# Patient Record
Sex: Female | Born: 1970 | Race: White | Hispanic: No | Marital: Single | Smoking: Never smoker
Health system: Southern US, Community
[De-identification: ages and names within clinical notes are randomized; demographics above are authoritative.]

## PROBLEM LIST (undated history)

## (undated) ENCOUNTER — Emergency Department (HOSPITAL_COMMUNITY): Admission: EM | Payer: Medicaid Other | Source: Home / Self Care

## (undated) DIAGNOSIS — Z789 Other specified health status: Secondary | ICD-10-CM

## (undated) DIAGNOSIS — T7840XA Allergy, unspecified, initial encounter: Secondary | ICD-10-CM

## (undated) HISTORY — DX: Other specified health status: Z78.9

## (undated) HISTORY — DX: Allergy, unspecified, initial encounter: T78.40XA

## (undated) HISTORY — PX: KNEE SURGERY: SHX244

---

## 2014-11-28 ENCOUNTER — Emergency Department (HOSPITAL_COMMUNITY)
Admission: EM | Admit: 2014-11-28 | Discharge: 2014-11-28 | Disposition: A | Discharge status: Home / Self Care | Payer: 59 | Source: Home / Self Care | Attending: Emergency Medicine | Admitting: Emergency Medicine

## 2014-11-28 ENCOUNTER — Encounter (HOSPITAL_COMMUNITY): Payer: Self-pay

## 2014-11-28 DIAGNOSIS — Z23 Encounter for immunization: Secondary | ICD-10-CM | POA: Diagnosis not present

## 2014-11-28 DIAGNOSIS — S61411A Laceration without foreign body of right hand, initial encounter: Secondary | ICD-10-CM | POA: Diagnosis not present

## 2014-11-28 MED ORDER — PENTAFLUOROPROP-TETRAFLUOROETH EX AERO
INHALATION_SPRAY | CUTANEOUS | Status: AC
  Filled 2014-11-28: qty 103.5

## 2014-11-28 MED ORDER — TETANUS-DIPHTH-ACELL PERTUSSIS 5-2.5-18.5 LF-MCG/0.5 IM SUSP
INTRAMUSCULAR | Status: AC
  Filled 2014-11-28: qty 0.5

## 2014-11-28 MED ORDER — TETANUS-DIPHTH-ACELL PERTUSSIS 5-2.5-18.5 LF-MCG/0.5 IM SUSP
0.5000 mL | Freq: Once | INTRAMUSCULAR | Status: AC
  Administered 2014-11-28: 0.5 mL via INTRAMUSCULAR

## 2014-11-28 NOTE — Discharge Instructions (Signed)

## 2014-11-28 NOTE — ED Notes (Signed)
Reportedly sustained laceration to her right hand while polishing a wine glass just PTA. Bleeding controlled at present

## 2014-11-28 NOTE — ED Provider Notes (Addendum)
CSN: [id number]     Arrival date & time 11/28/14  1634 History   First MD Initiated Contact with Patient 11/28/14 1705     Chief Complaint  Patient presents with  . Extremity Laceration   (Consider location/radiation/quality/duration/timing/severity/associated sxs/prior Treatment) HPI Comments: 44 year old female accidentally received a laceration to the right hand MCP of the index finger. This approximately 2 and half centimeters in length. There is a small flap has a terminal end.   History reviewed. No pertinent past medical history. History reviewed. No pertinent past surgical history. History reviewed. No pertinent family history. History  Substance Use Topics  . Smoking status: Never Smoker   . Smokeless tobacco: Not on file  . Alcohol Use: No   OB History    No data available     Review of Systems  Constitutional: Negative.   Musculoskeletal: Negative for joint swelling.  Skin: Positive for wound.    Allergies  Review of patient's allergies indicates not on file.  Home Medications   Prior to Admission medications   Not on File   BP 128/87 mmHg  Pulse 93  Temp(Src) 98.4 F (36.9 C) (Oral)  Resp 16  SpO2 100%  LMP 11/21/2014 Physical Exam  Constitutional: She is oriented to person, place, and time. She appears well-developed and well-nourished. No distress.  Musculoskeletal: She exhibits no edema or tenderness.  Neurological: She is alert and oriented to person, place, and time.  Skin: Skin is warm.  Right index finger with active flexion and extension at all joints including the MCP. The wound is superficial and includes the dermis in a portion of the subcutaneous tissue. No tendons or other underlying structures are visible or palpable. Minor bleeding.  Nursing note and vitals reviewed.   ED Course  LACERATION REPAIR Date/Time: 11/28/2014 6:19 PM Performed by: Marcha Dutton, Josemiguel Gries Authorized by: Melony Overly Consent: Verbal consent obtained. Risks and  benefits: risks, benefits and alternatives were discussed Consent given by: patient Patient understanding: patient states understanding of the procedure being performed Patient identity confirmed: verbally with patient Body area: upper extremity Location details: right hand Laceration length: 2.7 cm Foreign bodies: no foreign bodies Tendon involvement: none Nerve involvement: none Vascular damage: no Anesthesia: local infiltration Local anesthetic: lidocaine 2% without epinephrine Anesthetic total: 3 ml Patient sedated: no Preparation: Patient was prepped and draped in the usual sterile fashion. Irrigation solution: saline Irrigation method: syringe Amount of cleaning: standard Debridement: none Degree of undermining: none Skin closure: 4-0 nylon Number of sutures: 5 Technique: simple Approximation: close Approximation difficulty: simple Dressing: 4x4 sterile gauze Patient tolerance: Patient tolerated the procedure well with no immediate complications   (including critical care time) Labs Review Labs Reviewed - No data to display  Imaging Review No results found.   MDM   1. Laceration of hand, right, initial encounter    Laceration repair with 5 sutures. Watch for infection Suture removal in 10 days. Tdap .5cc IM Janne Napoleon, NP 11/28/14 1824  Janne Napoleon, NP 11/30/14 [phone]

## 2017-11-04 ENCOUNTER — Ambulatory Visit: Payer: Self-pay | Admitting: Obstetrics and Gynecology

## 2017-11-06 ENCOUNTER — Ambulatory Visit: Payer: Self-pay | Admitting: Obstetrics and Gynecology

## 2017-11-18 ENCOUNTER — Ambulatory Visit (INDEPENDENT_AMBULATORY_CARE_PROVIDER_SITE_OTHER): Payer: Medicaid Other | Admitting: Nurse Practitioner

## 2017-11-18 ENCOUNTER — Other Ambulatory Visit (HOSPITAL_COMMUNITY)
Admission: RE | Admit: 2017-11-18 | Discharge: 2017-11-18 | Disposition: A | Discharge status: Home / Self Care | Payer: Medicaid Other | Source: Ambulatory Visit | Attending: Nurse Practitioner | Admitting: Nurse Practitioner

## 2017-11-18 ENCOUNTER — Encounter: Payer: Self-pay | Admitting: Nurse Practitioner

## 2017-11-18 VITALS — BP 118/77 | HR 78 | Ht 64.0 in | Wt 148.2 lb

## 2017-11-18 DIAGNOSIS — F32A Depression, unspecified: Secondary | ICD-10-CM

## 2017-11-18 DIAGNOSIS — Z309 Encounter for contraceptive management, unspecified: Secondary | ICD-10-CM

## 2017-11-18 DIAGNOSIS — Z01411 Encounter for gynecological examination (general) (routine) with abnormal findings: Secondary | ICD-10-CM | POA: Insufficient documentation

## 2017-11-18 DIAGNOSIS — F329 Major depressive disorder, single episode, unspecified: Secondary | ICD-10-CM

## 2017-11-18 DIAGNOSIS — N921 Excessive and frequent menstruation with irregular cycle: Secondary | ICD-10-CM | POA: Insufficient documentation

## 2017-11-18 MED ORDER — MEDROXYPROGESTERONE ACETATE 150 MG/ML IM SUSP: 150.0000 mg | INTRAMUSCULAR | 5 refills | Status: DC

## 2017-11-18 MED ORDER — FLUOXETINE HCL 20 MG PO CAPS: 20.0000 mg | ORAL_CAPSULE | Freq: Every day | ORAL | 3 refills | Status: DC

## 2017-11-18 MED ORDER — FLUOXETINE HCL 20 MG PO TABS: 20.0000 mg | ORAL_TABLET | Freq: Every day | ORAL | 3 refills | Status: DC

## 2017-11-18 NOTE — Patient Instructions (Addendum)
Living With Depression Everyone experiences occasional disappointment, sadness, and loss in their lives. When you are feeling down, blue, or sad for at least 2 weeks in a row, it may mean that you have depression. Depression can affect your thoughts and feelings, relationships, daily activities, and physical health. It is caused by changes in the way your brain functions. If you receive a diagnosis of depression, your health care provider will tell you which type of depression you have and what treatment options are available to you. If you are living with depression, there are ways to help you recover from it and also ways to prevent it from coming back. How to cope with lifestyle changes Coping with stress Stress is your body's reaction to life changes and events, both good and bad. Stressful situations may include:  Getting married.  The death of a spouse.  Losing a job.  Retiring.  Having a baby.  Stress can last just a few hours or it can be ongoing. Stress can play a major role in depression, so it is important to learn both how to cope with stress and how to think about it differently. Talk with your health care provider or a counselor if you would like to learn more about stress reduction. He or she may suggest some stress reduction techniques, such as:  Music therapy. This can include creating music or listening to music. Choose music that you enjoy and that inspires you.  Mindfulness-based meditation. This kind of meditation can be done while sitting or walking. It involves being aware of your normal breaths, rather than trying to control your breathing.  Centering prayer. This is a kind of meditation that involves focusing on a spiritual word or phrase. Choose a word, phrase, or sacred image that is meaningful to you and that brings you peace.  Deep breathing. To do this, expand your stomach and inhale slowly through your nose. Hold your breath for 3-5 seconds, then exhale  slowly, allowing your stomach muscles to relax.  Muscle relaxation. This involves intentionally tensing muscles then relaxing them.  Choose a stress reduction technique that fits your lifestyle and personality. Stress reduction techniques take time and practice to develop. Set aside 5-15 minutes a day to do them. Therapists can offer training in these techniques. The training may be covered by some insurance plans. Other things you can do to manage stress include:  Keeping a stress diary. This can help you learn what triggers your stress and ways to control your response.  Understanding what your limits are and saying no to requests or events that lead to a schedule that is too full.  Thinking about how you respond to certain situations. You may not be able to control everything, but you can control how you react.  Adding humor to your life by watching funny films or TV shows.  Making time for activities that help you relax and not feeling guilty about spending your time this way.  Medicines Your health care provider may suggest certain medicines if he or she feels that they will help improve your condition. Avoid using alcohol and other substances that may prevent your medicines from working properly (may interact). It is also important to:  Talk with your pharmacist or health care provider about all the medicines that you take, their possible side effects, and what medicines are safe to take together.  Make it your goal to take part in all treatment decisions (shared decision-making). This includes giving input on the side   effects of medicines. It is best if shared decision-making with your health care provider is part of your total treatment plan.  If your health care provider prescribes a medicine, you may not notice the full benefits of it for 4-8 weeks. Most people who are treated for depression need to be on medicine for at least 6-12 months after they feel better. If you are taking  medicines as part of your treatment, do not stop taking medicines without first talking to your health care provider. You may need to have the medicine slowly decreased (tapered) over time to decrease the risk of harmful side effects. Relationships Your health care provider may suggest family therapy along with individual therapy and drug therapy. While there may not be family problems that are causing you to feel depressed, it is still important to make sure your family learns as much as they can about your mental health. Having your family's support can help make your treatment successful. How to recognize changes in your condition Everyone has a different response to treatment for depression. Recovery from major depression happens when you have not had signs of major depression for two months. This may mean that you will start to:  Have more interest in doing activities.  Feel less hopeless than you did 2 months ago.  Have more energy.  Overeat less often, or have better or improving appetite.  Have better concentration.  Your health care provider will work with you to decide the next steps in your recovery. It is also important to recognize when your condition is getting worse. Watch for these signs:  Having fatigue or low energy.  Eating too much or too little.  Sleeping too much or too little.  Feeling restless, agitated, or hopeless.  Having trouble concentrating or making decisions.  Having unexplained physical complaints.  Feeling irritable, angry, or aggressive.  Get help as soon as you or your family members notice these symptoms coming back. How to get support and help from others How to talk with friends and family members about your condition Talking to friends and family members about your condition can provide you with one way to get support and guidance. Reach out to trusted friends or family members, explain your symptoms to them, and let them know that you are  working with a health care provider to treat your depression. Financial resources Not all insurance plans cover mental health care, so it is important to check with your insurance carrier. If paying for co-pays or counseling services is a problem, search for a local or county mental health care center. They may be able to offer public mental health care services at low or no cost when you are not able to see a private health care provider. If you are taking medicine for depression, you may be able to get the generic form, which may be less expensive. Some makers of prescription medicines also offer help to patients who cannot afford the medicines they need. Follow these instructions at home:  Get the right amount and quality of sleep.  Cut down on using caffeine, tobacco, alcohol, and other potentially harmful substances.  Try to exercise, such as walking or lifting small weights.  Take over-the-counter and prescription medicines only as told by your health care provider.  Eat a healthy diet that includes plenty of vegetables, fruits, whole grains, low-fat dairy products, and lean protein. Do not eat a lot of foods that are high in solid fats, added sugars, or salt.    Keep all follow-up visits as told by your health care provider. This is important. Contact a health care provider if:  You stop taking your antidepressant medicines, and you have any of these symptoms: ? Nausea. ? Headache. ? Feeling lightheaded. ? Chills and body aches. ? Not being able to sleep (insomnia).  You or your friends and family think your depression is getting worse. Get help right away if:  You have thoughts of hurting yourself or others. If you ever feel like you may hurt yourself or others, or have thoughts about taking your own life, get help right away. You can go to your nearest emergency department or call:  Your local emergency services (911 in the [place]  A suicide crisis helpline, such as the  National Suicide Prevention Lifeline at 1-800-273-8255. This is open 24-hours a day.  Summary  If you are living with depression, there are ways to help you recover from it and also ways to prevent it from coming back.  Work with your health care team to create a management plan that includes counseling, stress management techniques, and healthy lifestyle habits. This information is not intended to replace advice given to you by your health care provider. Make sure you discuss any questions you have with your health care provider. Document Released: 09/03/2016 Document Revised: 09/03/2016 Document Reviewed: 09/03/2016 Elsevier Interactive Patient Education  2018 Elsevier Inc.  

## 2017-11-18 NOTE — Addendum Note (Signed)
Addended by: Merryl Hacker on: 11/18/2017 01:41 PM   Modules accepted: Orders

## 2017-11-18 NOTE — Progress Notes (Signed)
GYNECOLOGY ANNUAL PREVENTATIVE CARE ENCOUNTER NOTE  Subjective:   Sheryl Bradley is a 47 y.o. G80P2002 female here for a routine annual gynecologic exam.  Current complaints: depression due to mother's death in 06/16/2023 and she left her current partner after learning he was bisexual and was having problems with drug addiction.   She currently cares for her 2 children and works.  Denies abnormal vaginal bleeding, discharge, pelvic pain, or problems with intercourse.  Does report needing STD evaluation, needing Prozac for her current depression and crying that she has periodically almost daily, needs hemorrhoids checked, and had a bump she wants checked on right labia majora.  Having very heavy menses and now irregular menses.  Thinking this is early menopause.    Gynecologic History Patient's last menstrual period was 11/14/2017 (exact date). Contraception: abstinence Last Pap: was 2 years ago.  Reports results were normal.  Previously had a "scraping" of her cervix due to an abnormal pap - says yes it was a leep.  Last mammogram: was a coupla of years ago.  Results were: normal  Obstetric History OB History  Gravida Para Term Preterm AB Living  2 2 2     2   SAB TAB Ectopic Multiple Live Births          2    # Outcome Date GA Lbr Len/2nd Weight Sex Delivery Anes PTL Lv  2 Term 2013        LIV  1 Term 2003        LIV      Past Medical History:  Diagnosis Date  . Known health problems: none     Past Surgical History:  Procedure Laterality Date  . KNEE SURGERY Left     Current Outpatient Medications on File Prior to Visit  Medication Sig Dispense Refill  . Biotin 1000 MCG tablet Take 1,000 mcg by mouth 3 (three) times daily.    . diphenhydrAMINE (BENADRYL) 25 mg capsule Take 25 mg by mouth at bedtime.    . ferrous sulfate 325 (65 FE) MG EC tablet Take 325 mg by mouth daily.     No current facility-administered medications on file prior to visit.     No Known  Allergies  Social History   Socioeconomic History  . Marital status: Married    Spouse name: Not on file  . Number of children: Not on file  . Years of education: Not on file  . Highest education level: Not on file  Social Needs  . Financial resource strain: Not on file  . Food insecurity - worry: Not on file  . Food insecurity - inability: Not on file  . Transportation needs - medical: Not on file  . Transportation needs - non-medical: Not on file  Occupational History  . Not on file  Tobacco Use  . Smoking status: Never Smoker  . Smokeless tobacco: Never Used  Substance and Sexual Activity  . Alcohol use: No  . Drug use: Yes    Types: Marijuana    Comment: occasionally  . Sexual activity: Not Currently    Birth control/protection: None  Other Topics Concern  . Not on file  Social History Narrative  . Not on file    Family History  Problem Relation Age of Onset  . Hypertension Paternal Grandfather   . Hypertension Paternal Grandmother   . Hypertension Maternal Grandmother   . Hypertension Maternal Grandfather   . Hypertension Father   . Heart attack Mother   .  Hypertension Mother   . Stroke Mother   . Alcohol abuse Brother     The following portions of the patient's history were reviewed and updated as appropriate: allergies, current medications, past family history, past medical history, past social history, past surgical history and problem list.  Review of Systems A comprehensive review of systems was negative except for: occasional chest pain on the left side since her mother died of a heart attack in 07/05/2017.  No dizziness, no fainting, no palpitations, or episodes of sweating. Marland Kitchen  Has no edema in her legs, does have hemorrhoids but no constipation Has heavy menses with cramping that starts a week before the bleeding begins - she does not take medication for cramping. Has a bump in the genital area that she has felt but it is not painful. Has tearful  times since the loss of her mother and the father of her children is not in the picture.   Objective:  BP 118/77   Pulse 78   Ht 5\' 4"  (1.626 m)   Wt 148 lb 3.2 oz (67.2 kg)   LMP 11/14/2017 (Exact Date)   BMI 25.44 kg/m  CONSTITUTIONAL: Well-developed, well-nourished female who does become tearful when talking about the recent losses in her life.  HENT:  Normocephalic, atraumatic, External right and left ear normal.  EYES: Conjunctivae and EOM are normal. Pupils are equal, round,  NECK: Normal range of motion, supple, no masses.  Normal thyroid.  SKIN: Skin is warm and dry. No rash noted. Not diaphoretic. No erythema. No pallor. NEUROLOGIC: Alert and oriented to person, place, and time. Normal muscle tone and coordination. No cranial nerve deficit noted. PSYCHIATRIC: Normal mood and affect. Normal behavior. Normal judgment and thought content. CARDIOVASCULAR: Normal heart rate noted, regular rhythm RESPIRATORY: Clear to auscultation bilaterally. Effort and breath sounds normal, no problems with respiration noted. BREASTS: Symmetric in size. No masses, skin changes, nipple drainage, or lymphadenopathy. ABDOMEN: Soft, no distention noted.  No tenderness, rebound or guarding.  PELVIC: Normal appearing external genitalia; normal appearing vaginal mucosa and cervix.  No abnormal discharge noted.  Pap smear obtained.  Normal uterine size, no other palpable masses, no uterine or adnexal tenderness.  No bump found on labia and client reports it is not there at present.  Pointed out the spot where it had been.  No skin lesions seen or masses palpated. MUSCULOSKELETAL: Normal range of motion. No tenderness.  No cyanosis, clubbing, or edema.    Assessment and Plan:  1. Encounter for well woman exam with abnormal findings Recommend that she get the mammography scholarship information as [facility] will likely not cover the mammogram. Discussed at length the options for controlling the heavy vaginal  bleeding and severe cramping with her menses.  She declines any "foreign object" in her body.  Discussed progestin only pills or Depo injections and she is much more interested in the Depo shot.  She has a twin sister that uses Depo and likes it.  Will order and she will return for the injection.  - Cytology - PAP - Cervicovaginal ancillary only - Hepatitis B surface antigen - HIV antibody - RPR - Ambulatory referral to [facility] - CBC - Hepatitis C antibody  2. Depression, unspecified depression type PHQ9 done at the end of the visit with a score of 25.  Client reported in her visit that she has lots of friends who she talks with regularly.  Reports eating 2 meals a day and sleeping from 5-8 hours  a night.  Reports she did have suicidal ideations after her mother died but is not having them now and is not having suicidal ideations today.  (PQH9 results reviewed and her score is much higher than I expected it would be based on our discussion at the time of her exam.)  Is requesting Prozac as she has taken it before and does think it will be helpful.  Has tried 2 other medications for depression in the past and they were not helpful.  Thinks that Prozac will be a good choice for her.  Reviewed the self care activities that she is doing - not smoking, not drinking, does eat daily and BMI is currently 25.  Discussed losing 4 pounds so she will have a BMI that is under 25.  She reports she is not exercising and thinks that is something that she can do.  Advised making a list of activities that will be care for her such as having a cup of tea in a relaxing atmosphere.  Also advised being intentional about noticing when she has good days and then knowing that every day will not be a good day and using self care activities especially on those days.   Will follow up results of pap smear and manage accordingly. Mammogram scheduled Behavioral health referral made.  Will need depression followed up  there. Routine preventative health maintenance measures emphasized. Please refer to After Visit Summary for other counseling recommendations.  Advised that she consider establishing with a PCP as that would be the doctor to see if she continues to have concerns about her heart - at this time, it seems to be an over identification with her mother's heart attack at age 32.  Reviewed how for her to be healthy - see note above about points that were discussed.  Earlie Server, RN, MSN, NP-BC Nurse Practitioner, [facility] for [facility]

## 2017-11-19 ENCOUNTER — Ambulatory Visit (INDEPENDENT_AMBULATORY_CARE_PROVIDER_SITE_OTHER): Payer: Medicaid Other

## 2017-11-19 DIAGNOSIS — Z309 Encounter for contraceptive management, unspecified: Secondary | ICD-10-CM | POA: Diagnosis not present

## 2017-11-19 DIAGNOSIS — Z3042 Encounter for surveillance of injectable contraceptive: Secondary | ICD-10-CM

## 2017-11-19 LAB — HEPATITIS B SURFACE ANTIGEN: Hepatitis B Surface Ag: NEGATIVE

## 2017-11-19 LAB — CERVICOVAGINAL ANCILLARY ONLY
Bacterial vaginitis: NEGATIVE
CHLAMYDIA, DNA PROBE: NEGATIVE
Candida vaginitis: POSITIVE — AB
Neisseria Gonorrhea: NEGATIVE
Trichomonas: NEGATIVE

## 2017-11-19 LAB — CBC
Hematocrit: 39.4 % (ref 34.0–46.6)
Hemoglobin: 12.9 g/dL (ref 11.1–15.9)
MCH: 28.2 pg (ref 26.6–33.0)
MCHC: 32.7 g/dL (ref 31.5–35.7)
MCV: 86 fL (ref 79–97)
PLATELETS: 313 10*3/uL (ref 150–379)
RBC: 4.57 x10E6/uL (ref 3.77–5.28)
RDW: 16.3 % — ABNORMAL HIGH (ref 12.3–15.4)
WBC: 6.1 10*3/uL (ref 3.4–10.8)

## 2017-11-19 LAB — HEPATITIS C ANTIBODY: Hep C Virus Ab: <0.1 s/co ratio (ref 0.0–0.9)

## 2017-11-19 LAB — RPR: RPR Ser Ql: NONREACTIVE

## 2017-11-19 LAB — HIV ANTIBODY (ROUTINE TESTING W REFLEX): HIV SCREEN 4TH GENERATION: NONREACTIVE

## 2017-11-19 MED ORDER — MEDROXYPROGESTERONE ACETATE 150 MG/ML IM SUSP
150.0000 mg | Freq: Once | INTRAMUSCULAR | Status: AC
  Administered 2017-11-19: 150 mg via INTRAMUSCULAR

## 2017-11-19 NOTE — Progress Notes (Signed)
Pt here for depo shot. Pt was seen in the office yesterday. Inj given in left upper outer quad. Pt tolerated well. Next depo due between 4/23-5/8.

## 2017-11-20 ENCOUNTER — Other Ambulatory Visit: Payer: Self-pay | Admitting: Nurse Practitioner

## 2017-11-20 ENCOUNTER — Encounter: Payer: Self-pay | Admitting: Nurse Practitioner

## 2017-11-20 ENCOUNTER — Telehealth: Payer: Self-pay

## 2017-11-20 MED ORDER — FLUCONAZOLE 150 MG PO TABS: 150.0000 mg | ORAL_TABLET | Freq: Every day | ORAL | 0 refills | Status: DC

## 2017-11-20 NOTE — Progress Notes (Signed)
Lab results reviewed and yeast was seen.  Sent prescription for Diflucan to client's pharmacy and sent a MyChart message to her.  Earlie Server, RN, MSN, NP-BC Nurse Practitioner, [facility] for [employer], [facility] 11/20/2017 11:05 AM

## 2017-11-20 NOTE — Telephone Encounter (Signed)
TC to pt regarding message about results.  Pt made aware in detail about +yeast result  And comfort measures to help in preventing yeast such as including probiotics in diet and avoiding too much sugar  Pt voiced understanding.

## 2017-11-21 ENCOUNTER — Encounter: Payer: Self-pay | Admitting: Nurse Practitioner

## 2017-11-21 LAB — CYTOLOGY - PAP
Diagnosis: NEGATIVE
HPV: NOT DETECTED

## 2017-11-25 ENCOUNTER — Encounter: Payer: Self-pay | Admitting: Nurse Practitioner

## 2017-11-27 ENCOUNTER — Telehealth: Payer: Self-pay

## 2017-11-27 NOTE — Telephone Encounter (Signed)
Returned call, no answer, left vm 

## 2017-11-29 ENCOUNTER — Other Ambulatory Visit: Payer: Self-pay | Admitting: Nurse Practitioner

## 2017-11-29 DIAGNOSIS — Z1231 Encounter for screening mammogram for malignant neoplasm of breast: Secondary | ICD-10-CM

## 2017-12-04 ENCOUNTER — Encounter: Payer: Self-pay | Admitting: Nurse Practitioner

## 2018-01-02 ENCOUNTER — Encounter: Payer: Self-pay | Admitting: Nurse Practitioner

## 2018-01-08 ENCOUNTER — Telehealth: Payer: Self-pay

## 2018-01-08 NOTE — Telephone Encounter (Signed)
Called pt in response to mychart message, no answer, left vm to call.

## 2018-02-13 ENCOUNTER — Ambulatory Visit: Payer: Medicaid Other

## 2018-02-14 ENCOUNTER — Ambulatory Visit: Payer: Medicaid Other

## 2018-02-17 ENCOUNTER — Ambulatory Visit (INDEPENDENT_AMBULATORY_CARE_PROVIDER_SITE_OTHER): Payer: Medicaid Other | Admitting: *Deleted

## 2018-02-17 ENCOUNTER — Other Ambulatory Visit: Payer: Self-pay | Admitting: Nurse Practitioner

## 2018-02-17 VITALS — BP 123/68 | HR 78 | Wt 142.0 lb

## 2018-02-17 DIAGNOSIS — Z3042 Encounter for surveillance of injectable contraceptive: Secondary | ICD-10-CM

## 2018-02-17 MED ORDER — MEDROXYPROGESTERONE ACETATE 150 MG/ML IM SUSP
150.0000 mg | Freq: Once | INTRAMUSCULAR | Status: AC
  Administered 2018-02-17: 150 mg via INTRAMUSCULAR

## 2018-02-17 MED ORDER — FLUOXETINE HCL 20 MG PO CAPS: 20.0000 mg | ORAL_CAPSULE | Freq: Every day | ORAL | 0 refills | Status: DC

## 2018-02-17 NOTE — Progress Notes (Signed)
Liisa [facility] 47 y.o. Client requests refill for Prozac.  Has no insurance and has not been seen by a therapist as requested when given the Prozac prescription initially.  Will refill for 30 days only and client must find a PCP or other provider to continue to get her Prozac.    Earlie Server, RN, MSN, NP-BC Nurse Practitioner, [facility] for [employer], [facility] 02/17/2018 3:50 PM

## 2018-02-17 NOTE — Progress Notes (Addendum)
Pt is in office for depo injection.  Pt is on time for injection. Pt supplied depo for this injection. Pt tolerated injection well. Pt advised to RTO July 22-Aug 5 for next depo.  BP 123/68   Pulse 78   Wt 142 lb (64.4 kg)   BMI 24.37 kg/m    Administrations This Visit    medroxyPROGESTERone (DEPO-PROVERA) injection 150 mg    Admin Date 02/17/2018 Action Given Dose 150 mg Route Intramuscular Administered By Valene Bors, CMA

## 2018-02-18 NOTE — Progress Notes (Signed)
I have reviewed the chart and agree with nursing staff's documentation of this patient's encounter.  Morene Crocker, CNM 02/18/2018 9:36 AM

## 2018-02-19 ENCOUNTER — Telehealth: Payer: Self-pay | Admitting: *Deleted

## 2018-02-19 NOTE — Telephone Encounter (Signed)
LM on VM making pt aware of Rx refill and need to establish care elsewhere in order to continue on medication. Advised to contact office if any other questions.

## 2018-02-19 NOTE — Telephone Encounter (Signed)
-----   Message from Virginia Rochester, NP sent at 02/17/2018  3:54 PM EDT ----- Regarding: RE: Prozac Rx Have refilled for 30 days with no refills and she must establish with a provider to continue to get this medication.  I cannot see her for a medication visit.  Thanks, Terri  ----- Message ----- From: Valene Bors, CMA Sent: 02/17/2018   2:34 PM To: Virginia Rochester, NP Subject: Prozac Rx                                      Karna Christmas,  You saw this pt in our office in Feb and gave her Rx for Prozac with 3 refills.  She was seen today for her depo injection and is in need of refills on Prozac.  I made her aware that you referred her for counseling in order to maintain Rx.  Pt has not been able to be seen at this time as she has no insurance. Pt plans to try to get in with [facility] since her son is seen there.  Please advise if you can refill Rx and/or if pt should have follow up appt here for med manage.  thanks

## 2018-03-09 ENCOUNTER — Encounter: Payer: Self-pay | Admitting: Nurse Practitioner

## 2018-05-02 ENCOUNTER — Encounter: Payer: Self-pay | Admitting: Nurse Practitioner

## 2018-05-08 ENCOUNTER — Ambulatory Visit: Payer: Medicaid Other

## 2018-05-13 ENCOUNTER — Ambulatory Visit: Payer: Medicaid Other

## 2018-05-16 ENCOUNTER — Ambulatory Visit: Payer: Medicaid Other

## 2018-05-19 ENCOUNTER — Ambulatory Visit (INDEPENDENT_AMBULATORY_CARE_PROVIDER_SITE_OTHER): Payer: Medicaid Other | Admitting: *Deleted

## 2018-05-19 ENCOUNTER — Ambulatory Visit: Payer: Medicaid Other

## 2018-05-19 VITALS — Wt 151.0 lb

## 2018-05-19 DIAGNOSIS — Z3042 Encounter for surveillance of injectable contraceptive: Secondary | ICD-10-CM | POA: Diagnosis not present

## 2018-05-19 MED ORDER — MEDROXYPROGESTERONE ACETATE 150 MG/ML IM SUSP
150.0000 mg | Freq: Once | INTRAMUSCULAR | Status: AC
  Administered 2018-05-19: 150 mg via INTRAMUSCULAR

## 2018-05-19 NOTE — Progress Notes (Signed)
Pt is in office for depo injection.  Pt supplied depo for today's visit.  Pt is on time for injection.  Pt has no other concerns today.  Pt advised to RTO 08/10/18 for next depo.  Administrations This Visit    medroxyPROGESTERone (DEPO-PROVERA) injection 150 mg    Admin Date 05/19/2018 Action Given Dose 150 mg Route Intramuscular Administered By Valene Bors, CMA

## 2018-05-19 NOTE — Progress Notes (Signed)
I have reviewed the chart and agree with nursing staff's documentation of this patient's encounter.  Mora Bellman, MD 05/19/2018 4:40 PM

## 2018-05-21 ENCOUNTER — Ambulatory Visit: Payer: Medicaid Other

## 2018-08-04 ENCOUNTER — Ambulatory Visit (INDEPENDENT_AMBULATORY_CARE_PROVIDER_SITE_OTHER): Payer: Medicaid Other | Admitting: *Deleted

## 2018-08-04 ENCOUNTER — Ambulatory Visit: Payer: Medicaid Other

## 2018-08-04 VITALS — BP 129/92 | HR 118 | Wt 147.0 lb

## 2018-08-04 DIAGNOSIS — Z3042 Encounter for surveillance of injectable contraceptive: Secondary | ICD-10-CM | POA: Diagnosis not present

## 2018-08-04 MED ORDER — MEDROXYPROGESTERONE ACETATE 150 MG/ML IM SUSP
150.0000 mg | Freq: Once | INTRAMUSCULAR | Status: AC
  Administered 2018-08-04: 150 mg via INTRAMUSCULAR

## 2018-08-04 NOTE — Progress Notes (Signed)
Pt is in office today for Depo injection. Pt is on time for injection.  Pt states she is currently bleeding heavily. Pt states that she has been passing large clots as well.  Injection given, pt tolerated well.  Pt advised to monitor bleeding- in worsens or no change- call office for appt or discuss alternatives. Pt states understanding.  BP (!) 129/92   Pulse (!) 118   Wt 147 lb (66.7 kg)   BMI 25.23 kg/m   Administrations This Visit    medroxyPROGESTERone (DEPO-PROVERA) injection 150 mg    Admin Date 08/04/2018 Action Given Dose 150 mg Route Intramuscular Administered By Valene Bors, CMA

## 2018-08-05 NOTE — Progress Notes (Signed)
I have reviewed the chart and agree with nursing staff's documentation of this patient's encounter.  Mora Bellman, MD 08/05/2018 3:48 PM

## 2018-08-11 ENCOUNTER — Ambulatory Visit: Payer: Medicaid Other

## 2018-10-20 ENCOUNTER — Ambulatory Visit: Payer: Medicaid Other

## 2018-10-22 ENCOUNTER — Ambulatory Visit: Payer: Medicaid Other

## 2018-10-22 NOTE — Progress Notes (Deleted)
disregard

## 2018-10-27 ENCOUNTER — Ambulatory Visit (INDEPENDENT_AMBULATORY_CARE_PROVIDER_SITE_OTHER): Payer: Medicaid Other

## 2018-10-27 VITALS — BP 105/79 | HR 85 | Wt 148.6 lb

## 2018-10-27 DIAGNOSIS — Z3042 Encounter for surveillance of injectable contraceptive: Secondary | ICD-10-CM

## 2018-10-27 MED ORDER — MEDROXYPROGESTERONE ACETATE 150 MG/ML IM SUSP
150.0000 mg | Freq: Once | INTRAMUSCULAR | Status: AC
  Administered 2018-10-27: 150 mg via INTRAMUSCULAR

## 2018-10-27 NOTE — Progress Notes (Signed)
Presents for DEPO, given in [place], tolerated well.  Next DEPO 03/31-04/14/2020  Administrations This Visit    medroxyPROGESTERone (DEPO-PROVERA) injection 150 mg    Admin Date 10/27/2018 Action Given Dose 150 mg Route Intramuscular Administered By Tamela Oddi, RMA

## 2019-01-14 ENCOUNTER — Ambulatory Visit: Payer: Medicaid Other | Admitting: Obstetrics and Gynecology

## 2019-01-14 ENCOUNTER — Ambulatory Visit: Payer: Medicaid Other

## 2019-01-15 ENCOUNTER — Telehealth: Payer: Self-pay

## 2019-01-15 ENCOUNTER — Other Ambulatory Visit: Payer: Self-pay | Admitting: Nurse Practitioner

## 2019-01-15 MED ORDER — MEGESTROL ACETATE 40 MG PO TABS: ORAL_TABLET | ORAL | 1 refills | Status: DC

## 2019-01-15 NOTE — Telephone Encounter (Signed)
The patient called and reports she has been bleeding since her last depo shot a couple months ago. She wants to stop the depo and would like to know if there is anything we can give her to stop the bleeding currently. She has an appt for a televisit on 4/7 where she wants to talk about switching her contraception from depo to something else.

## 2019-01-15 NOTE — Progress Notes (Signed)
Client called.  Having extended vaginal bleeding after being on Depo for one year.  Will prescribe Megace to help the bleeding stop and client has a visit scheduled to discuss other contraceptive options.  Earlie Server, RN, MSN, NP-BC Nurse Practitioner, [facility] for [employer], [place] 01/15/2019 4:34 PM

## 2019-01-20 ENCOUNTER — Ambulatory Visit (INDEPENDENT_AMBULATORY_CARE_PROVIDER_SITE_OTHER): Payer: Medicaid Other | Admitting: Student

## 2019-01-20 ENCOUNTER — Other Ambulatory Visit: Payer: Self-pay

## 2019-01-20 DIAGNOSIS — N939 Abnormal uterine and vaginal bleeding, unspecified: Secondary | ICD-10-CM

## 2019-01-20 NOTE — Progress Notes (Signed)
   TELEHEALTH VIRTUAL GYNECOLOGY VISIT ENCOUNTER NOTE  I connected with Sheryl Bradley on 01/20/19 at  3:35 PM EDT by telephone at home and verified that I am speaking with the correct person using two identifiers.   I discussed the limitations, risks, security and privacy concerns of performing an evaluation and management service by telephone and the availability of in person appointments. I also discussed with the patient that there may be a patient responsible charge related to this service. The patient expressed understanding and agreed to proceed.   History:  Sheryl Bradley is a 48 y.o. G58P2002 female being evaluated today for abnormal uterine bleeding. She denies any abnormal vaginal discharge,pelvic pain or other concerns.  She last had a pap in 2019; she is due for an annual exam. Patient says that she has bleeding for months on Depo. She had called the clinic and on 4-2 and was concerned about bleeding; was given RX for Megace and 1 refill. She says that her bleeding both on and off Depo was the same: heavy with clots. She is feeling better on Megace; now only using a menstrual pad.      Past Medical History:  Diagnosis Date  . Known health problems: none    Past Surgical History:  Procedure Laterality Date  . KNEE SURGERY Left    The following portions of the patient's history were reviewed and updated as appropriate: allergies, current medications, past family history, past medical history, past social history, past surgical history and problem list.   Health Maintenance:  Normal pap and negative HRHPV on 11-2017.    Review of Systems:  Pertinent items noted in HPI and remainder of comprehensive ROS otherwise negative.  Physical Exam:   General:  Alert, oriented and cooperative.   Mental Status: Normal mood and affect perceived. Normal judgment and thought content.  Physical exam deferred due to nature of the encounter  Labs and Imaging No results found for this or any  previous visit (from the past 336 hour(s)). No results found.    Assessment and Plan:     1. Abnormal uterine bleeding -Patient has responded well to Megace, will continue to take daily. She has a refill.  -Recommend IUD; she will come in at the end of the month for IUD and annual; while Covid-19 distancing is recommended, patient reports a significant amount of bleeding and warrants an in-person visit.  -discuss order for Mammogram at next visit.        I discussed the assessment and treatment plan with the patient. The patient was provided an opportunity to ask questions and all were answered. The patient agreed with the plan and demonstrated an understanding of the instructions.   The patient was advised to call back or seek an in-person evaluation/go to the ED if the symptoms worsen or if the condition fails to improve as anticipated.  I provided 15 minutes of non-face-to-face time during this encounter.   Starr Lake, [place] for [employer], [place]

## 2019-02-23 ENCOUNTER — Ambulatory Visit (INDEPENDENT_AMBULATORY_CARE_PROVIDER_SITE_OTHER): Payer: Medicaid Other | Admitting: Advanced Practice Midwife

## 2019-02-23 ENCOUNTER — Other Ambulatory Visit (HOSPITAL_COMMUNITY)
Admission: RE | Admit: 2019-02-23 | Discharge: 2019-02-23 | Disposition: A | Discharge status: Home / Self Care | Payer: Medicaid Other | Source: Ambulatory Visit | Attending: Advanced Practice Midwife | Admitting: Advanced Practice Midwife

## 2019-02-23 ENCOUNTER — Other Ambulatory Visit: Payer: Self-pay

## 2019-02-23 ENCOUNTER — Encounter: Payer: Self-pay | Admitting: Advanced Practice Midwife

## 2019-02-23 VITALS — BP 138/82 | HR 80 | Wt 151.2 lb

## 2019-02-23 DIAGNOSIS — Z0000 Encounter for general adult medical examination without abnormal findings: Secondary | ICD-10-CM

## 2019-02-23 DIAGNOSIS — Z01419 Encounter for gynecological examination (general) (routine) without abnormal findings: Secondary | ICD-10-CM | POA: Insufficient documentation

## 2019-02-23 DIAGNOSIS — Z30430 Encounter for insertion of intrauterine contraceptive device: Secondary | ICD-10-CM

## 2019-02-23 DIAGNOSIS — K644 Residual hemorrhoidal skin tags: Secondary | ICD-10-CM

## 2019-02-23 DIAGNOSIS — Z3202 Encounter for pregnancy test, result negative: Secondary | ICD-10-CM | POA: Diagnosis not present

## 2019-02-23 DIAGNOSIS — N939 Abnormal uterine and vaginal bleeding, unspecified: Secondary | ICD-10-CM

## 2019-02-23 LAB — POCT URINE PREGNANCY: Preg Test, Ur: NEGATIVE

## 2019-02-23 MED ORDER — LEVONORGESTREL 19.5 MCG/DAY IU IUD
INTRAUTERINE_SYSTEM | Freq: Once | INTRAUTERINE | Status: AC
  Administered 2019-02-23: 16:00:00 via INTRAUTERINE

## 2019-02-23 MED ORDER — MEGESTROL ACETATE 40 MG PO TABS: 40.0000 mg | ORAL_TABLET | Freq: Two times a day (BID) | ORAL | 1 refills | Status: DC

## 2019-02-23 NOTE — Progress Notes (Signed)
Pt presents for IUD insertion for abnormal UT bleeding.

## 2019-02-23 NOTE — Patient Instructions (Signed)

## 2019-02-23 NOTE — Progress Notes (Signed)
refeSubjective:     Sheryl Bradley is a 48 y.o. female here for a routine exam.  Current complaints: wants to switch birth control methods.  She has heavy vaginal bleeding, improved with recent Megace prescription.  She also has hemorrhoids that sometimes cause pain. She tried Depo x 1 year for contraception but bleeding was still heavy with large clots with irregular menses.  She desires an IUD for contraception.  Personal health questionnaire reviewed: yes.  Pt has primary care and is up to date on health maintenance.    Gynecologic History Patient's last menstrual period was 01/20/2019. Contraception: Depo-Provera injections Last Pap: 2019. Results were: normal Last mammogram: 3-4 years ago. Results were: normal  Obstetric History OB History  Gravida Para Term Preterm AB Living  2 2 2     2   SAB TAB Ectopic Multiple Live Births          2    # Outcome Date GA Lbr Len/2nd Weight Sex Delivery Anes PTL Lv  2 Term 2013        LIV  1 Term 2003        LIV     The following portions of the patient's history were reviewed and updated as appropriate: allergies, current medications, past family history, past medical history, past social history, past surgical history and problem list.  Review of Systems Pertinent items noted in HPI and remainder of comprehensive ROS otherwise negative.    Objective:     BP 138/82   Pulse 80   Wt 68.6 kg   LMP 01/20/2019   BMI 25.95 kg/m   VS reviewed, nursing note reviewed,  Constitutional: well developed, well nourished, no distress HEENT: normocephalic CV: normal rate Pulm/chest wall: normal effort Breast Exam:  right breast normal without mass, skin or nipple changes or axillary nodes, left breast normal without mass, skin or nipple changes or axillary nodes Abdomen: soft Neuro: alert and oriented x 3 Skin: warm, dry Psych: affect normal Pelvic exam: Cervix pink, visually closed, without lesion, scant white creamy discharge, vaginal walls  and external genitalia normal Bimanual exam: Cervix 0/long/high, firm, anterior, neg CMT, uterus nontender, nonenlarged, adnexa without tenderness, enlargement, or mass  IUD Procedure Note Patient identified, informed consent performed.  Discussed risks of irregular bleeding, cramping, infection, malpositioning or misplacement of the IUD outside the uterus which may require further procedures. Time out was performed.  Urine pregnancy test negative.  Speculum placed in the vagina.  Cervix visualized.  Cleaned with Betadine x 2.  Grasped anteriorly with a single tooth tenaculum.  Uterus sounded to 8 cm.  Liletta IUD placed per manufacturer's recommendations.  Strings trimmed to 3 cm. Tenaculum was removed, good hemostasis noted.  Patient tolerated procedure well.   Patient was given post-procedure instructions and the Liletta care card with expiration date.  Patient was also asked to check IUD strings periodically and follow up in 4-6 weeks for IUD check.      Assessment/Plan:   1. Encounter for insertion of intrauterine contraceptive device (IUD) --Pregnancy test negative --See procedure note - POCT urine pregnancy  2. Hemorrhoids, external --No thrombosed hemorrhoids, pt desires to f/u to discuss possible removal/treatment. - Ambulatory referral to Gastroenterology  3. Well woman exam with routine gynecological exam --Doing well, desires contraception change to IUD. Has heavy menses, hopes IUD with help with this also. - Cytology - PAP( [place]) - MM DIGITAL SCREENING BILATERAL; Future  4. Abnormal uterine bleeding (AUB) --Rx to help with bleeding if  not improved with IUD - megestrol (MEGACE) 40 MG tablet; Take 1 tablet (40 mg total) by mouth 2 (two) times daily.  Dispense: 60 tablet; Refill: 1     Follow up in: 4 weeks for string check or as needed.   Fatima Blank, CNM 4:27 PM

## 2019-02-25 LAB — CYTOLOGY - PAP
Diagnosis: UNDETERMINED — AB
HPV: NOT DETECTED

## 2019-02-26 ENCOUNTER — Encounter: Payer: Self-pay | Admitting: Advanced Practice Midwife

## 2019-02-26 DIAGNOSIS — R87610 Atypical squamous cells of undetermined significance on cytologic smear of cervix (ASC-US): Secondary | ICD-10-CM | POA: Insufficient documentation

## 2019-03-10 ENCOUNTER — Ambulatory Visit: Payer: Medicaid Other | Admitting: Obstetrics and Gynecology

## 2019-03-23 ENCOUNTER — Ambulatory Visit: Payer: Medicaid Other | Admitting: Advanced Practice Midwife

## 2019-03-24 ENCOUNTER — Ambulatory Visit: Payer: Medicaid Other | Admitting: Advanced Practice Midwife

## 2019-03-30 ENCOUNTER — Ambulatory Visit: Payer: Medicaid Other | Admitting: Obstetrics and Gynecology

## 2019-04-10 ENCOUNTER — Ambulatory Visit: Payer: Medicaid Other | Admitting: Obstetrics and Gynecology

## 2019-05-01 ENCOUNTER — Telehealth (HOSPITAL_COMMUNITY): Payer: Self-pay

## 2019-05-01 NOTE — Telephone Encounter (Signed)
Telephoned patient at home number. Left message that we received her mammo scholarship application.  Please call back.

## 2020-04-14 DIAGNOSIS — Z419 Encounter for procedure for purposes other than remedying health state, unspecified: Secondary | ICD-10-CM | POA: Diagnosis not present

## 2020-05-15 DIAGNOSIS — Z419 Encounter for procedure for purposes other than remedying health state, unspecified: Secondary | ICD-10-CM | POA: Diagnosis not present

## 2020-06-15 DIAGNOSIS — Z419 Encounter for procedure for purposes other than remedying health state, unspecified: Secondary | ICD-10-CM | POA: Diagnosis not present

## 2020-07-15 DIAGNOSIS — Z419 Encounter for procedure for purposes other than remedying health state, unspecified: Secondary | ICD-10-CM | POA: Diagnosis not present

## 2020-08-15 DIAGNOSIS — Z419 Encounter for procedure for purposes other than remedying health state, unspecified: Secondary | ICD-10-CM | POA: Diagnosis not present

## 2020-09-14 DIAGNOSIS — Z419 Encounter for procedure for purposes other than remedying health state, unspecified: Secondary | ICD-10-CM | POA: Diagnosis not present

## 2020-09-19 DIAGNOSIS — J309 Allergic rhinitis, unspecified: Secondary | ICD-10-CM | POA: Diagnosis not present

## 2020-09-19 DIAGNOSIS — Z20822 Contact with and (suspected) exposure to covid-19: Secondary | ICD-10-CM | POA: Diagnosis not present

## 2020-09-19 DIAGNOSIS — R059 Cough, unspecified: Secondary | ICD-10-CM | POA: Diagnosis not present

## 2020-09-19 DIAGNOSIS — J209 Acute bronchitis, unspecified: Secondary | ICD-10-CM | POA: Diagnosis not present

## 2020-10-15 DIAGNOSIS — Z419 Encounter for procedure for purposes other than remedying health state, unspecified: Secondary | ICD-10-CM | POA: Diagnosis not present

## 2020-11-15 DIAGNOSIS — Z419 Encounter for procedure for purposes other than remedying health state, unspecified: Secondary | ICD-10-CM | POA: Diagnosis not present

## 2020-12-08 ENCOUNTER — Telehealth: Payer: Self-pay | Admitting: Advanced Practice Midwife

## 2020-12-13 ENCOUNTER — Other Ambulatory Visit: Payer: Self-pay

## 2020-12-13 ENCOUNTER — Encounter (HOSPITAL_BASED_OUTPATIENT_CLINIC_OR_DEPARTMENT_OTHER): Payer: Self-pay

## 2020-12-13 ENCOUNTER — Ambulatory Visit (HOSPITAL_BASED_OUTPATIENT_CLINIC_OR_DEPARTMENT_OTHER)
Admission: RE | Admit: 2020-12-13 | Discharge: 2020-12-13 | Disposition: A | Discharge status: Home / Self Care | Payer: Medicaid Other | Source: Ambulatory Visit | Attending: Obstetrics and Gynecology | Admitting: Obstetrics and Gynecology

## 2020-12-13 ENCOUNTER — Other Ambulatory Visit: Payer: Self-pay | Admitting: Obstetrics and Gynecology

## 2020-12-13 ENCOUNTER — Ambulatory Visit: Payer: Medicaid Other | Admitting: Advanced Practice Midwife

## 2020-12-13 ENCOUNTER — Other Ambulatory Visit (HOSPITAL_BASED_OUTPATIENT_CLINIC_OR_DEPARTMENT_OTHER): Payer: Self-pay

## 2020-12-13 DIAGNOSIS — Z419 Encounter for procedure for purposes other than remedying health state, unspecified: Secondary | ICD-10-CM | POA: Diagnosis not present

## 2020-12-13 DIAGNOSIS — Z1231 Encounter for screening mammogram for malignant neoplasm of breast: Secondary | ICD-10-CM | POA: Diagnosis not present

## 2020-12-19 ENCOUNTER — Ambulatory Visit: Payer: Medicaid Other | Admitting: Advanced Practice Midwife

## 2020-12-28 NOTE — Telephone Encounter (Signed)
Pt sent Mychart message to [facility] pool c/o hemorrhoids. Left HIPAA complaint message for pt to contact the office if she still needs assistance.

## 2021-01-09 ENCOUNTER — Ambulatory Visit: Payer: Medicaid Other | Admitting: Advanced Practice Midwife

## 2021-01-10 ENCOUNTER — Ambulatory Visit: Payer: Medicaid Other | Admitting: Advanced Practice Midwife

## 2021-01-13 DIAGNOSIS — Z419 Encounter for procedure for purposes other than remedying health state, unspecified: Secondary | ICD-10-CM | POA: Diagnosis not present

## 2021-02-12 DIAGNOSIS — Z419 Encounter for procedure for purposes other than remedying health state, unspecified: Secondary | ICD-10-CM | POA: Diagnosis not present

## 2021-03-15 DIAGNOSIS — Z419 Encounter for procedure for purposes other than remedying health state, unspecified: Secondary | ICD-10-CM | POA: Diagnosis not present

## 2021-03-24 DIAGNOSIS — U071 COVID-19: Secondary | ICD-10-CM | POA: Diagnosis not present

## 2021-04-05 ENCOUNTER — Ambulatory Visit: Payer: Medicaid Other | Admitting: Women's Health

## 2021-04-14 DIAGNOSIS — Z419 Encounter for procedure for purposes other than remedying health state, unspecified: Secondary | ICD-10-CM | POA: Diagnosis not present

## 2021-04-22 DIAGNOSIS — B372 Candidiasis of skin and nail: Secondary | ICD-10-CM | POA: Diagnosis not present

## 2021-04-22 DIAGNOSIS — L309 Dermatitis, unspecified: Secondary | ICD-10-CM | POA: Diagnosis not present

## 2021-04-22 DIAGNOSIS — L0100 Impetigo, unspecified: Secondary | ICD-10-CM | POA: Diagnosis not present

## 2021-05-02 DIAGNOSIS — Z139 Encounter for screening, unspecified: Secondary | ICD-10-CM | POA: Diagnosis not present

## 2021-05-15 DIAGNOSIS — Z419 Encounter for procedure for purposes other than remedying health state, unspecified: Secondary | ICD-10-CM | POA: Diagnosis not present

## 2021-06-15 DIAGNOSIS — Z419 Encounter for procedure for purposes other than remedying health state, unspecified: Secondary | ICD-10-CM | POA: Diagnosis not present

## 2021-07-15 DIAGNOSIS — Z419 Encounter for procedure for purposes other than remedying health state, unspecified: Secondary | ICD-10-CM | POA: Diagnosis not present

## 2021-07-18 ENCOUNTER — Encounter: Payer: Self-pay | Admitting: Obstetrics and Gynecology

## 2021-07-18 ENCOUNTER — Ambulatory Visit (INDEPENDENT_AMBULATORY_CARE_PROVIDER_SITE_OTHER): Payer: Medicaid Other | Admitting: Obstetrics and Gynecology

## 2021-07-18 ENCOUNTER — Other Ambulatory Visit: Payer: Self-pay

## 2021-07-18 ENCOUNTER — Other Ambulatory Visit (HOSPITAL_COMMUNITY)
Admission: RE | Admit: 2021-07-18 | Discharge: 2021-07-18 | Disposition: A | Discharge status: Home / Self Care | Payer: Medicaid Other | Source: Ambulatory Visit | Attending: Women's Health | Admitting: Women's Health

## 2021-07-18 DIAGNOSIS — Z01419 Encounter for gynecological examination (general) (routine) without abnormal findings: Secondary | ICD-10-CM | POA: Insufficient documentation

## 2021-07-18 DIAGNOSIS — Z30431 Encounter for routine checking of intrauterine contraceptive device: Secondary | ICD-10-CM

## 2021-07-18 DIAGNOSIS — N920 Excessive and frequent menstruation with regular cycle: Secondary | ICD-10-CM

## 2021-07-18 DIAGNOSIS — T8332XA Displacement of intrauterine contraceptive device, initial encounter: Secondary | ICD-10-CM

## 2021-07-18 NOTE — Progress Notes (Signed)
GYNECOLOGY ANNUAL PREVENTATIVE CARE ENCOUNTER NOTE  History:     Sheryl Bradley is a 50 y.o. G50P2002 female here for a routine annual gynecologic exam.  Current complaints: continued heavy vaginal bleeding, wants IUD removed.   Denies abnormal vaginal bleeding, discharge, pelvic pain, problems with intercourse or other gynecologic concerns.   Pt states menstrual duration is slightly shorter, but menses are still heavy with clots.   Gynecologic History No LMP recorded. Contraception: IUD Last Pap: 02/2019. Results were: ASCUS with negative HPV Last mammogram: 12/13/20. Results were: normal  Obstetric History OB History  Gravida Para Term Preterm AB Living  2 2 2     2   SAB IAB Ectopic Multiple Live Births          2    # Outcome Date GA Lbr Len/2nd Weight Sex Delivery Anes PTL Lv  2 Term 2013        LIV  1 Term 2003        LIV    Past Medical History:  Diagnosis Date   Known health problems: none     Past Surgical History:  Procedure Laterality Date   KNEE SURGERY Left     Current Outpatient Medications on File Prior to Visit  Medication Sig Dispense Refill   Biotin 1000 MCG tablet Take 1,000 mcg by mouth 3 (three) times daily.     diphenhydrAMINE (BENADRYL) 25 mg capsule Take 25 mg by mouth at bedtime.     ferrous sulfate 325 (65 FE) MG EC tablet Take 325 mg by mouth daily.     ALPRAZolam (XANAX) 0.5 MG tablet  (Patient not taking: Reported on 07/18/2021)     citalopram (CELEXA) 10 MG tablet Take 10 mg by mouth daily. (Patient not taking: Reported on 07/18/2021)     fluconazole (DIFLUCAN) 150 MG tablet Take 1 tablet (150 mg total) by mouth daily. (Patient not taking: No sig reported) 1 tablet 0   FLUoxetine (PROZAC) 20 MG capsule Take 1 capsule (20 mg total) by mouth daily. (Patient not taking: No sig reported) 30 capsule 0   medroxyPROGESTERone (DEPO-PROVERA) 150 MG/ML injection Inject 1 mL (150 mg total) into the muscle every 3 (three) months. (Patient not taking: No  sig reported) 1 mL 5   megestrol (MEGACE) 40 MG tablet Take 1 tablet (40 mg total) by mouth 2 (two) times daily. (Patient not taking: Reported on 07/18/2021) 60 tablet 1   No current facility-administered medications on file prior to visit.    No Known Allergies  Social History:  reports that she has never smoked. She has never used smokeless tobacco. She reports that she does not currently use alcohol. She reports that she does not currently use drugs after having used the following drugs: Marijuana.  Family History  Problem Relation Age of Onset   Heart attack Mother    Hypertension Mother    Stroke Mother    Cancer Father    Hypertension Father    Alcohol abuse Brother    Hypertension Maternal Grandmother    Hypertension Maternal Grandfather    Hypertension Paternal Grandmother    Hypertension Paternal Grandfather     The following portions of the patient's history were reviewed and updated as appropriate: allergies, current medications, past family history, past medical history, past social history, past surgical history and problem list.  Review of Systems Pertinent items noted in HPI and remainder of comprehensive ROS otherwise negative.  Physical Exam:  BP 138/87   Pulse 76  Ht 5\' 4"  (1.626 m)   Wt 166 lb (75.3 kg)   BMI 28.49 kg/m  CONSTITUTIONAL: Well-developed, well-nourished female in no acute distress.  HENT:  Normocephalic, atraumatic, External right and left ear normal. Oropharynx is clear and moist EYES: Conjunctivae and EOM are normal.  NECK: Normal range of motion, supple, no masses.  Normal thyroid.  SKIN: Skin is warm and dry. No rash noted. Not diaphoretic. No erythema. No pallor. MUSCULOSKELETAL: Normal range of motion. No tenderness.  No cyanosis, clubbing, or edema.  2+ distal pulses. NEUROLOGIC: Alert and oriented to person, place, and time. Normal reflexes, muscle tone coordination.  PSYCHIATRIC: Normal mood and affect. Normal behavior. Normal  judgment and thought content. CARDIOVASCULAR: Normal heart rate noted, regular rhythm RESPIRATORY: Clear to auscultation bilaterally. Effort and breath sounds normal, no problems with respiration noted. BREASTS: Symmetric in size. No masses, tenderness, skin changes, nipple drainage, or lymphadenopathy bilaterally. Performed in the presence of a chaperone. ABDOMEN: Soft, no distention noted.  No tenderness, rebound or guarding.  PELVIC: Normal appearing external genitalia and urethral meatus; normal appearing vaginal mucosa and cervix.  No abnormal discharge noted.  Pap smear obtained.  Normal uterine size, no other palpable masses, no uterine or adnexal tenderness.  No IUD strings seen.  Performed in the presence of a chaperone.   Assessment and Plan:    1. Women's annual routine gynecological examination Normal annual exam - Cytology - PAP( [place])  2. Menorrhagia with regular cycle Unsure if pt has fibroids, uterus feels normal in size.  , lack of IUD could explain heavier bleeding - US PELVIC COMPLETE WITH TRANSVAGINAL; Future  3. Intrauterine contraceptive device threads lost, initial encounter No IUD strings seen, will check u/s for position - US PELVIC COMPLETE WITH TRANSVAGINAL; Future  Will follow up results of pap smear and manage accordingly. Routine preventative health maintenance measures emphasized. Please refer to After Visit Summary for other counseling recommendations.    Will advise pt of plan once ultrasound results are seen.  Lynnda Shields, MD, [place] for [facility], [place]

## 2021-07-18 NOTE — Progress Notes (Signed)
Patient presents for Annual and IUD Removal.  Last pap: 02/23/2019 ASCUS  LMP:07/14/21 ended 07/17/21 Mammogram: 12/13/2020 WNL  Family Hx of Breast Cancer: None Contraception: IUD inserted 02/23/2019. Pt has Liletta.  CC: Weight gain w/ IUD and heavy periods and wants it removed.pt has a few concerns about giving plasma.

## 2021-07-20 LAB — CYTOLOGY - PAP
Comment: NEGATIVE
Diagnosis: NEGATIVE
High risk HPV: NEGATIVE

## 2021-07-22 ENCOUNTER — Emergency Department (HOSPITAL_BASED_OUTPATIENT_CLINIC_OR_DEPARTMENT_OTHER)
Admission: EM | Admit: 2021-07-22 | Discharge: 2021-07-22 | Disposition: A | Discharge status: Home / Self Care | Payer: Medicaid Other | Attending: Emergency Medicine | Admitting: Emergency Medicine

## 2021-07-22 ENCOUNTER — Other Ambulatory Visit: Payer: Self-pay

## 2021-07-22 ENCOUNTER — Encounter (HOSPITAL_BASED_OUTPATIENT_CLINIC_OR_DEPARTMENT_OTHER): Payer: Self-pay | Admitting: Urology

## 2021-07-22 DIAGNOSIS — R059 Cough, unspecified: Secondary | ICD-10-CM | POA: Diagnosis present

## 2021-07-22 DIAGNOSIS — J3489 Other specified disorders of nose and nasal sinuses: Secondary | ICD-10-CM | POA: Insufficient documentation

## 2021-07-22 DIAGNOSIS — J069 Acute upper respiratory infection, unspecified: Secondary | ICD-10-CM | POA: Insufficient documentation

## 2021-07-22 MED ORDER — ALBUTEROL SULFATE HFA 108 (90 BASE) MCG/ACT IN AERS
2.0000 | INHALATION_SPRAY | Freq: Once | RESPIRATORY_TRACT | Status: AC
  Administered 2021-07-22: 2 via RESPIRATORY_TRACT
  Filled 2021-07-22: qty 6.7

## 2021-07-22 MED ORDER — ALBUTEROL SULFATE HFA 108 (90 BASE) MCG/ACT IN AERS: 2.0000 | INHALATION_SPRAY | RESPIRATORY_TRACT | 0 refills | Status: AC | PRN

## 2021-07-22 MED ORDER — AEROCHAMBER PLUS FLO-VU MEDIUM MISC
1.0000 | Freq: Once | Status: AC
  Administered 2021-07-22: 1
  Filled 2021-07-22: qty 1

## 2021-07-22 NOTE — ED Notes (Signed)
Pt NAD, a/ox4. Pt verbalizes understanding of all DC and f/u instructions. All questions answered. Pt walks with steady gait to lobby at DC.  ? ?

## 2021-07-22 NOTE — ED Provider Notes (Signed)
[facility] EMERGENCY DEPARTMENT Provider Note   CSN: [id number] Arrival date & time: 07/22/21  1641     History Chief Complaint  Patient presents with   Nasal Congestion   Cough    Sheryl Bradley is a 50 y.o. female who presents today for evaluation of sinus congestion, and cough for 1 week.  She feels like she has been wheezing.  She has used about 8 puffs of her albuterol in the past 8 hours however feels like she is not getting any medicine out despite having puffs left based on the counter.  She denies any fevers.  She feels like her symptoms get worse at night when she lays down.    HPI     Past Medical History:  Diagnosis Date   Known health problems: none     Patient Active Problem List   Diagnosis Date Noted   Women's annual routine gynecological examination 07/18/2021   Menorrhagia 07/18/2021   IUD strings lost 07/18/2021   ASCUS of cervix with negative high risk HPV 02/26/2019   Abnormal uterine bleeding 01/20/2019    Past Surgical History:  Procedure Laterality Date   KNEE SURGERY Left      OB History     Gravida  2   Para  2   Term  2   Preterm      AB      Living  2      SAB      IAB      Ectopic      Multiple      Live Births  2           Family History  Problem Relation Age of Onset   Heart attack Mother    Hypertension Mother    Stroke Mother    Cancer Father    Hypertension Father    Alcohol abuse Brother    Hypertension Maternal Grandmother    Hypertension Maternal Grandfather    Hypertension Paternal Grandmother    Hypertension Paternal Grandfather     Social History   Tobacco Use   Smoking status: Never   Smokeless tobacco: Never  Vaping Use   Vaping Use: Never used  Substance Use Topics   Alcohol use: Not Currently   Drug use: Not Currently    Types: Marijuana    Comment: occasionally    Home Medications Prior to Admission medications   Medication Sig Start Date End Date Taking?  Authorizing Provider  albuterol (VENTOLIN HFA) 108 (90 Base) MCG/ACT inhaler Inhale 2 puffs into the lungs every 4 (four) hours as needed for wheezing or shortness of breath. 07/22/21  Yes Lorin Glass, PA-C  ALPRAZolam Duanne Moron) 0.5 MG tablet  02/04/19   [provider]  Biotin 1000 MCG tablet Take 1,000 mcg by mouth 3 (three) times daily.    [provider]  citalopram (CELEXA) 10 MG tablet Take 10 mg by mouth daily. Patient not taking: Reported on 07/18/2021    [provider]  diphenhydrAMINE (BENADRYL) 25 mg capsule Take 25 mg by mouth at bedtime.    [provider]  ferrous sulfate 325 (65 FE) MG EC tablet Take 325 mg by mouth daily.    [provider]  fluconazole (DIFLUCAN) 150 MG tablet Take 1 tablet (150 mg total) by mouth daily. Patient not taking: No sig reported 11/20/17   Virginia Rochester, NP  FLUoxetine (PROZAC) 20 MG capsule Take 1 capsule (20 mg total) by mouth daily. Patient  not taking: No sig reported 02/17/18   Virginia Rochester, NP  medroxyPROGESTERone (DEPO-PROVERA) 150 MG/ML injection Inject 1 mL (150 mg total) into the muscle every 3 (three) months. Patient not taking: No sig reported 11/18/17   Virginia Rochester, NP  megestrol (MEGACE) 40 MG tablet Take 1 tablet (40 mg total) by mouth 2 (two) times daily. Patient not taking: Reported on 07/18/2021 02/23/19   Elvera Maria, CNM    Allergies    Patient has no known allergies.  Review of Systems   Review of Systems  Constitutional:  Negative for chills and fever.  HENT:  Positive for congestion, postnasal drip, rhinorrhea and sore throat. Negative for sinus pressure, sinus pain, trouble swallowing and voice change.   Respiratory:  Positive for cough. Negative for chest tightness and shortness of breath.   Cardiovascular:  Negative for chest pain, palpitations and leg swelling.  Musculoskeletal:  Negative for neck pain and neck stiffness.  Neurological:  Negative for  headaches.  All other systems reviewed and are negative.  Physical Exam Updated Vital Signs BP 136/70 (BP Location: Right Arm)   Pulse 70   Temp 98.1 F (36.7 C) (Oral)   Resp 18   Ht 5\' 4"  (1.626 m)   Wt 75.3 kg   SpO2 98%   BMI 28.49 kg/m   Physical Exam Constitutional:      General: She is not in acute distress.    Appearance: She is well-developed. She is not ill-appearing or diaphoretic.  HENT:     Head: Normocephalic and atraumatic.     Right Ear: Tympanic membrane, ear canal and external ear normal.     Left Ear: Tympanic membrane, ear canal and external ear normal.     Nose: Mucosal edema, congestion and rhinorrhea present.     Mouth/Throat:     Mouth: Mucous membranes are moist.     Pharynx: Oropharynx is clear. Uvula midline. No oropharyngeal exudate or posterior oropharyngeal erythema.     Tonsils: No tonsillar exudate.  Eyes:     General: No scleral icterus.    Conjunctiva/sclera: Conjunctivae normal.  Cardiovascular:     Rate and Rhythm: Normal rate and regular rhythm.     Pulses: Normal pulses.     Heart sounds: Normal heart sounds.  Pulmonary:     Effort: Pulmonary effort is normal. No respiratory distress.     Breath sounds: Normal breath sounds. No wheezing.     Comments: Occasional cough Musculoskeletal:     Cervical back: Normal range of motion and neck supple.     Right lower leg: No edema.     Left lower leg: No edema.  Lymphadenopathy:     Cervical: No cervical adenopathy.  Skin:    General: Skin is warm and dry.  Neurological:     Mental Status: She is alert.     Comments: Patient is awake and alert.  Answers questions appropriately without difficulty.  Psychiatric:        Mood and Affect: Mood normal.        Behavior: Behavior normal.    ED Results / Procedures / Treatments   Labs (all labs ordered are listed, but only abnormal results are displayed) Labs Reviewed - No data to display  EKG None  Radiology No results  found.  Procedures Procedures   Medications Ordered in ED Medications  albuterol (VENTOLIN HFA) 108 (90 Base) MCG/ACT inhaler 2 puff (2 puffs Inhalation Given 07/22/21 2057)  AeroChamber Plus Flo-Vu Medium MISC  1 each (1 each Other Given 07/22/21 2102)    ED Course  I have reviewed the triage vital signs and the nursing notes.  Pertinent labs & imaging results that were available during my care of the patient were reviewed by me and considered in my medical decision making (see chart for details).    MDM Rules/Calculators/A&P                          Patient is a healthy 50 year old woman who presents today for evaluation of nasal congestion and cough that has been ongoing for a week.  Her primary concern is that she feels like her albuterol inhaler is not giving her medicine despite having additional puffs left on the counter. I examined the inhaler and it appears to be clogged and is not releasing any medications. She has nasal congestion and cough, has not been having fevers, body aches, and does not denies any concern for COVID.  Albuterol inhaler and spacer is ordered and I spoke with RT to facilitate training/teaching. She is additionally given prescription for additional albuterol inhalers as needed. Clinically she appears to have a viral URI.  She does not have a double sickening and has not had symptoms for more than 10 days, she does not appear to require antibiotics at this time.  She is able to ambulate without becoming hypoxic.  She does not have any chest pain or leg swelling, her symptoms do not appear consistent with ACS.  Return precautions were discussed with patient who states their understanding.  At the time of discharge patient denied any unaddressed complaints or concerns.  Patient is agreeable for discharge home.  Note: Portions of this report may have been transcribed using voice recognition software. Every effort was made to ensure accuracy; however,  inadvertent computerized transcription errors may be present   Final Clinical Impression(s) / ED Diagnoses Final diagnoses:  Upper respiratory tract infection, unspecified type    Rx / DC Orders ED Discharge Orders          Ordered    albuterol (VENTOLIN HFA) 108 (90 Base) MCG/ACT inhaler  Every 4 hours PRN        07/22/21 2051             Lorin Glass, PA-C 07/23/21 2354    Drenda Freeze, MD 07/24/21 1459

## 2021-07-22 NOTE — Discharge Instructions (Addendum)
Today you were given a albuterol prescription, along with an inhaler and a spacer.  Please make sure you are using the spacer anytime you are using the albuterol. If you develop constant shortness of breath, chest pain, or have leg swelling, rashes or any new or concerning symptoms please seek additional medical care and evaluation.

## 2021-07-22 NOTE — ED Triage Notes (Signed)
Sinus congestion, cough x 1 week, denies any fever.

## 2021-07-24 ENCOUNTER — Telehealth: Payer: Self-pay

## 2021-07-24 NOTE — Telephone Encounter (Signed)
Transition Care Management Unsuccessful Follow-up Telephone Call  Date of discharge and from where:  07/22/2021-[facility]  Attempts:  1st Attempt  Reason for unsuccessful TCM follow-up call:  Left voice message

## 2021-07-25 ENCOUNTER — Other Ambulatory Visit: Payer: Self-pay

## 2021-07-25 ENCOUNTER — Ambulatory Visit (HOSPITAL_COMMUNITY)
Admission: RE | Admit: 2021-07-25 | Discharge: 2021-07-25 | Disposition: A | Discharge status: Home / Self Care | Payer: Medicaid Other | Source: Ambulatory Visit | Attending: Obstetrics and Gynecology | Admitting: Obstetrics and Gynecology

## 2021-07-25 DIAGNOSIS — T8332XA Displacement of intrauterine contraceptive device, initial encounter: Secondary | ICD-10-CM | POA: Insufficient documentation

## 2021-07-25 DIAGNOSIS — D252 Subserosal leiomyoma of uterus: Secondary | ICD-10-CM | POA: Diagnosis not present

## 2021-07-25 DIAGNOSIS — N920 Excessive and frequent menstruation with regular cycle: Secondary | ICD-10-CM | POA: Insufficient documentation

## 2021-07-25 NOTE — Telephone Encounter (Signed)
Transition Care Management Unsuccessful Follow-up Telephone Call  Date of discharge and from where:  07/22/2021 from [facility]  Attempts:  2nd Attempt  Reason for unsuccessful TCM follow-up call:  Left voice message

## 2021-07-26 DIAGNOSIS — J209 Acute bronchitis, unspecified: Secondary | ICD-10-CM | POA: Diagnosis not present

## 2021-07-26 DIAGNOSIS — J019 Acute sinusitis, unspecified: Secondary | ICD-10-CM | POA: Diagnosis not present

## 2021-07-26 NOTE — Telephone Encounter (Signed)
Transition Care Management Unsuccessful Follow-up Telephone Call  Date of discharge and from where:  07/22/2021 from [facility]  Attempts:  3rd Attempt  Reason for unsuccessful TCM follow-up call:  Unable to reach patient

## 2021-07-27 ENCOUNTER — Ambulatory Visit (HOSPITAL_COMMUNITY)
Admission: RE | Admit: 2021-07-27 | Discharge: 2021-07-27 | Disposition: A | Discharge status: Home / Self Care | Payer: Medicaid Other | Source: Ambulatory Visit | Attending: Obstetrics and Gynecology | Admitting: Obstetrics and Gynecology

## 2021-07-27 ENCOUNTER — Other Ambulatory Visit: Payer: Self-pay | Admitting: Obstetrics and Gynecology

## 2021-07-27 ENCOUNTER — Telehealth: Payer: Self-pay

## 2021-07-27 ENCOUNTER — Other Ambulatory Visit: Payer: Self-pay

## 2021-07-27 DIAGNOSIS — X58XXXD Exposure to other specified factors, subsequent encounter: Secondary | ICD-10-CM | POA: Diagnosis not present

## 2021-07-27 DIAGNOSIS — T8332XD Displacement of intrauterine contraceptive device, subsequent encounter: Secondary | ICD-10-CM | POA: Insufficient documentation

## 2021-07-27 DIAGNOSIS — Z975 Presence of (intrauterine) contraceptive device: Secondary | ICD-10-CM | POA: Diagnosis not present

## 2021-07-27 DIAGNOSIS — D259 Leiomyoma of uterus, unspecified: Secondary | ICD-10-CM | POA: Insufficient documentation

## 2021-07-27 NOTE — Telephone Encounter (Signed)
Call placed to pt. No Answer, left VM for pt to return call to office.  Will send mychart message.  Colletta Maryland, RN

## 2021-07-27 NOTE — Telephone Encounter (Signed)
-----   Message from Griffin Basil, MD sent at 07/27/2021 12:28 PM EDT ----- No iud seen on ultrasound recommend abdominal xray to rule out abdominal position

## 2021-07-27 NOTE — Progress Notes (Signed)
Order placed for abdominal films secondary to no IUD on pelvic ultrasound

## 2021-07-27 NOTE — Telephone Encounter (Signed)
Call received back from pt. Pt given results and recommendations per Dr Elgie Congo. Will call and make pt X-ray appt. Pt verbalized understanding and agreeable to plan of care.  X-Ray appt can be walk in at any time. Pt notified.   Colletta Maryland, RN

## 2021-08-01 DIAGNOSIS — J209 Acute bronchitis, unspecified: Secondary | ICD-10-CM | POA: Diagnosis not present

## 2021-08-08 DIAGNOSIS — J209 Acute bronchitis, unspecified: Secondary | ICD-10-CM | POA: Diagnosis not present

## 2021-08-15 DIAGNOSIS — Z419 Encounter for procedure for purposes other than remedying health state, unspecified: Secondary | ICD-10-CM | POA: Diagnosis not present

## 2021-08-22 ENCOUNTER — Ambulatory Visit: Payer: Medicaid Other | Admitting: Nurse Practitioner

## 2021-09-12 ENCOUNTER — Ambulatory Visit: Payer: Medicaid Other | Admitting: Obstetrics and Gynecology

## 2021-09-14 DIAGNOSIS — Z419 Encounter for procedure for purposes other than remedying health state, unspecified: Secondary | ICD-10-CM | POA: Diagnosis not present

## 2021-10-15 DIAGNOSIS — Z419 Encounter for procedure for purposes other than remedying health state, unspecified: Secondary | ICD-10-CM | POA: Diagnosis not present

## 2021-10-24 ENCOUNTER — Ambulatory Visit: Payer: Medicaid Other | Admitting: Family Medicine

## 2021-10-24 NOTE — Progress Notes (Signed)
Opened in error

## 2021-11-08 IMAGING — US US PELVIS COMPLETE WITH TRANSVAGINAL
1 series · 13 of 25 positions shown · non-contrast
Comparison: 06/17/2003

CLINICAL DATA: Menorrhagia, missing IUD strings, question history
of fibroids, uncertain LMP

EXAM:
TRANSABDOMINAL AND TRANSVAGINAL ULTRASOUND OF PELVIS
TECHNIQUE: Both transabdominal and transvaginal ultrasound examinations of the
pelvis were performed. Transabdominal technique was performed for
global imaging of the pelvis including uterus, ovaries, adnexal
regions, and pelvic cul-de-sac. It was necessary to proceed with
endovaginal exam following the transabdominal exam to visualize the
IUD and ovaries.

[Series 1: us pelvis complete with transvaginal · 13 of 104 slices shown]
[im 1/104]
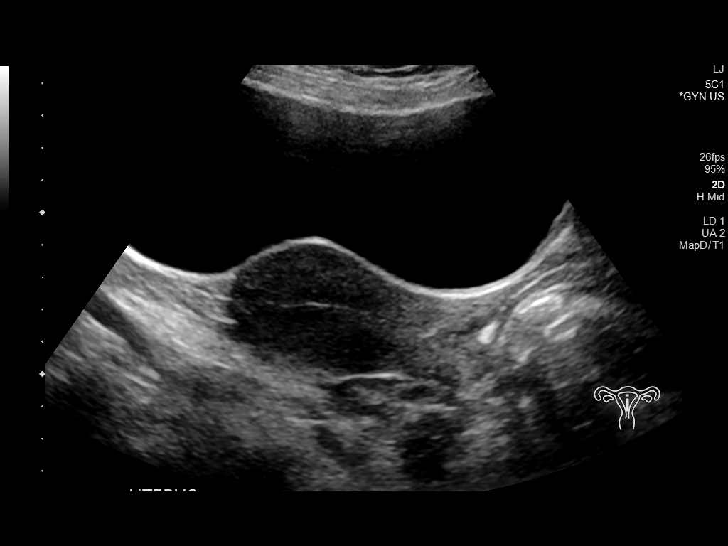
[im 9/104]
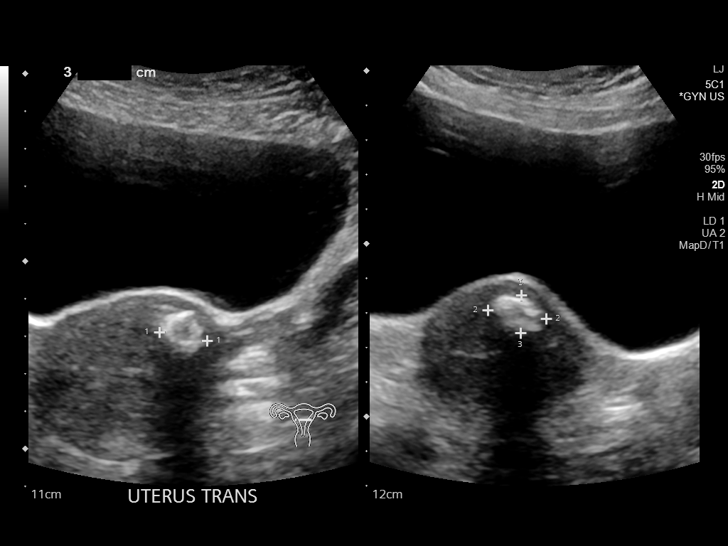
[im 18/104]
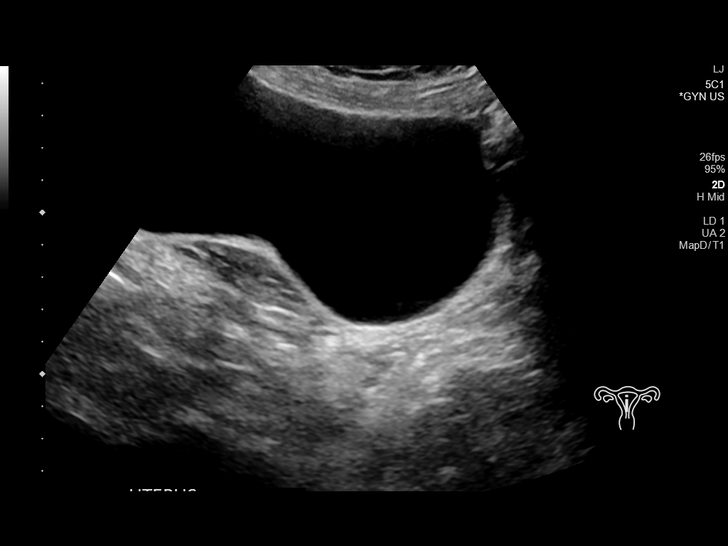
[im 26/104]
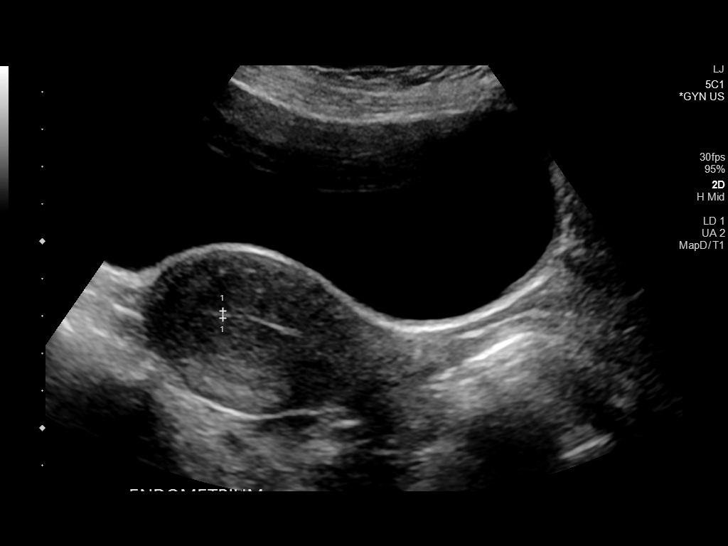
[im 35/104]
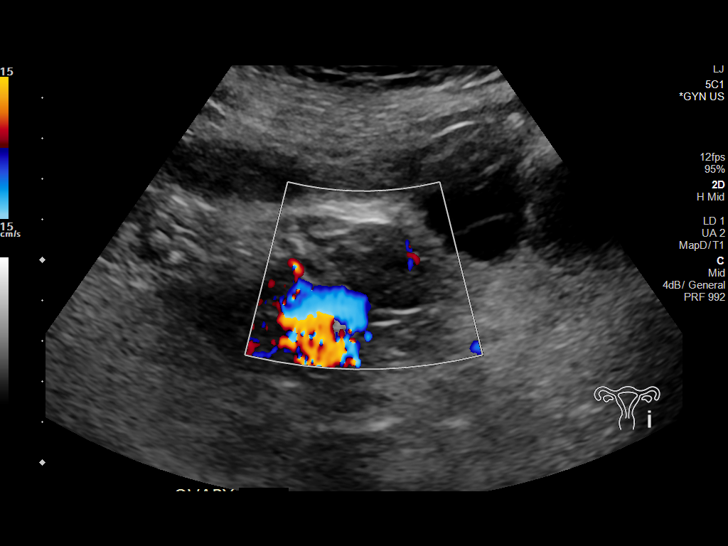
[im 43/104]
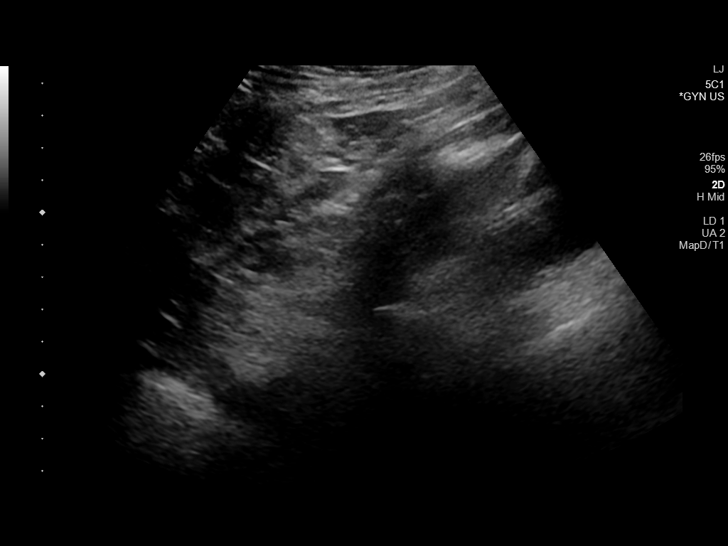
[im 52/104]
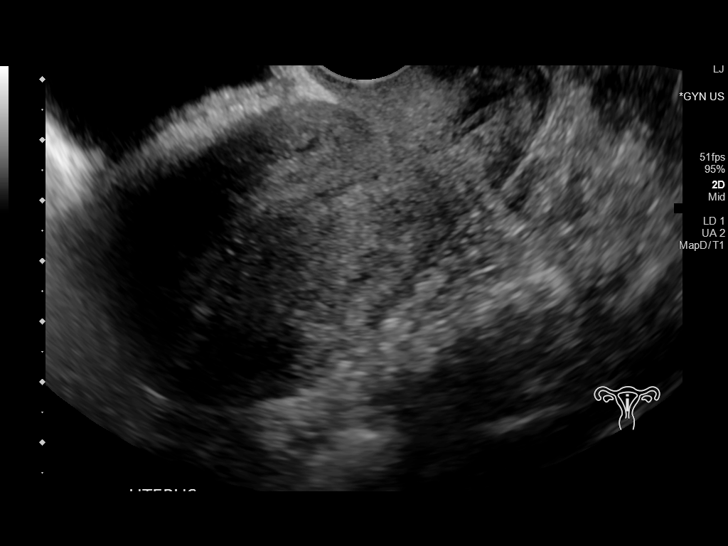
[im 61/104]
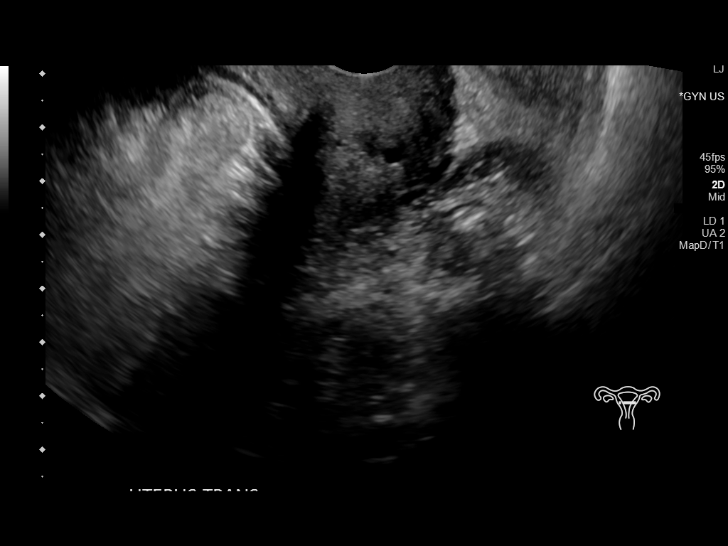
[im 69/104]
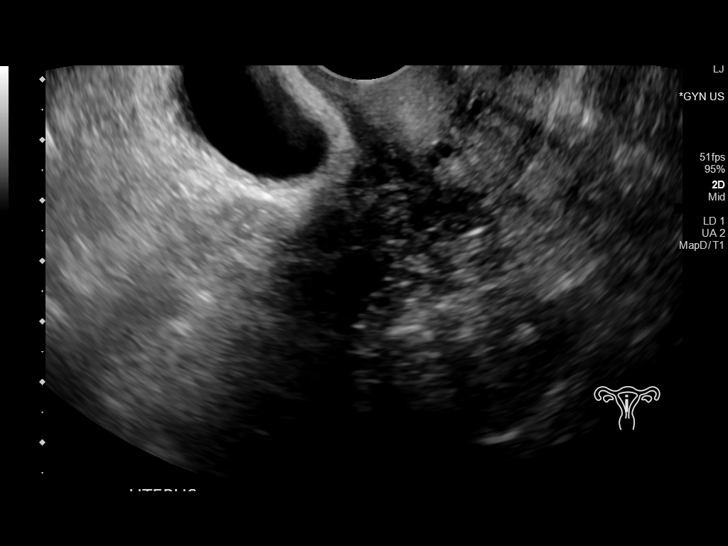
[im 78/104]
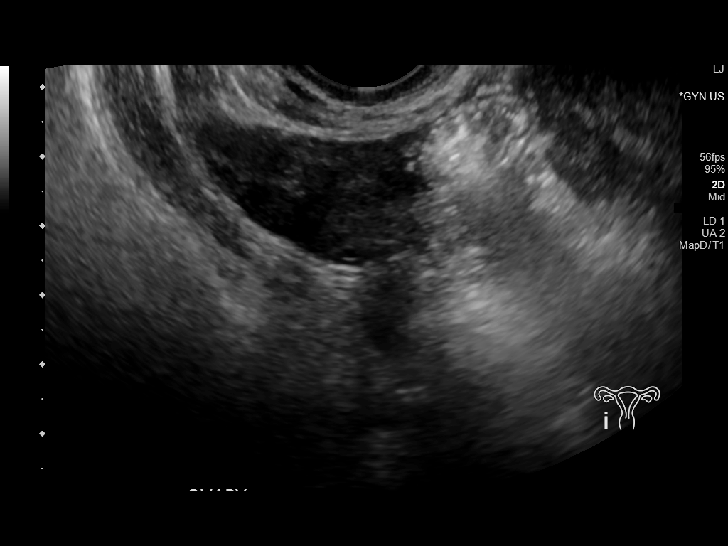
[im 86/104]
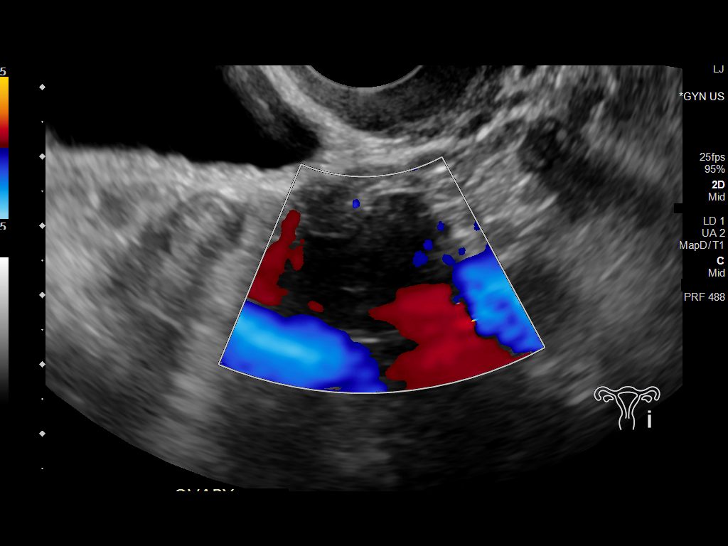
[im 95/104]
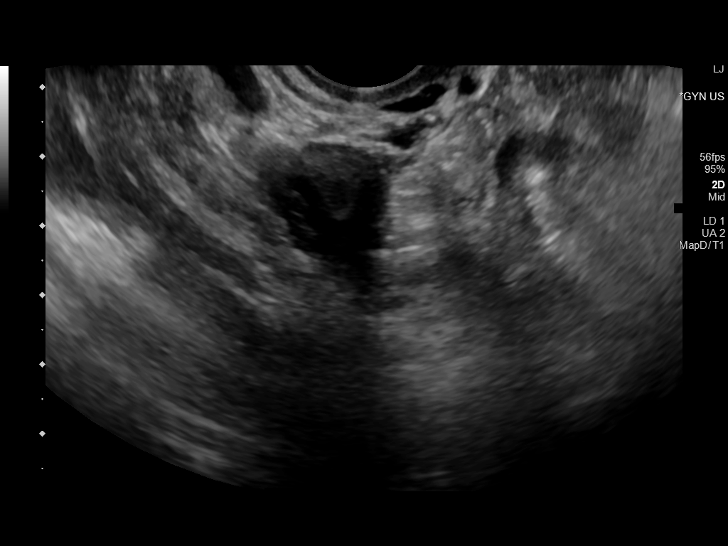
[im 104/104]
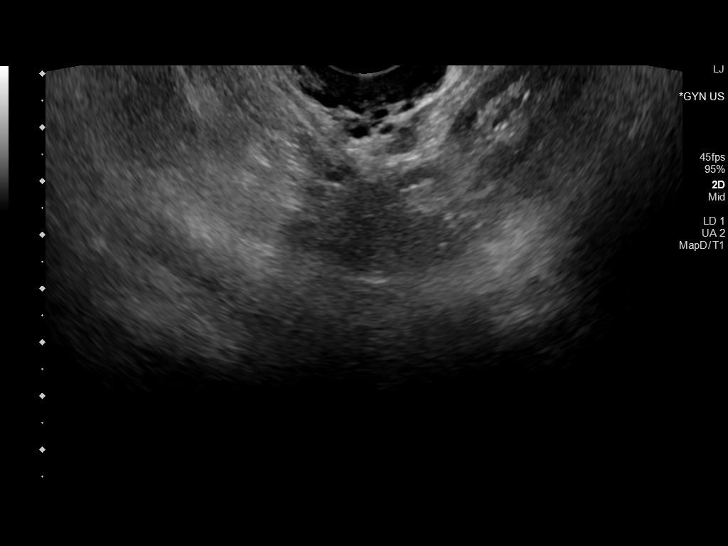

[13 of 25 positions shown; findings below may reference images not displayed]

FINDINGS: Uterus

Measurements: 8.7 x 4.1 x 4.8 cm = volume: 90 mL. Anteverted.
Slightly heterogeneous myometrium. Calcified subserosal leiomyoma at
upper LEFT uterus 17 mm diameter. No additional masses.

Endometrium

Thickness: 7 mm.  No endometrial fluid or mass.  No IUD visualized.

Right ovary

Measurements: 3.4 x 1.7 x 2.3 cm = volume: 7.0 mL. Normal morphology
without mass

Left ovary

Measurements: 2.5 x 2.0 x 1.4 cm = volume: 3.7 mL. Normal morphology
without mass

Other findings

No free pelvic fluid or adnexal masses.
IMPRESSION: Calcified 1.7 cm diameter subserosal leiomyoma at upper LEFT uterus.

Remainder of exam unremarkable.

Specifically, no IUD is visualized.

## 2021-11-10 IMAGING — DX DG ABDOMEN 2V
2 series · 2 of 2 positions shown · non-contrast
Comparison: Pelvic ultrasound 07/25/2021

CLINICAL DATA: Intrauterine contraceptive device threads lost,
subsequent encounter. No IUD and pelvic ultrasound, check abdomen.

EXAM:
ABDOMEN - 2 VIEW

[abdomen supine]
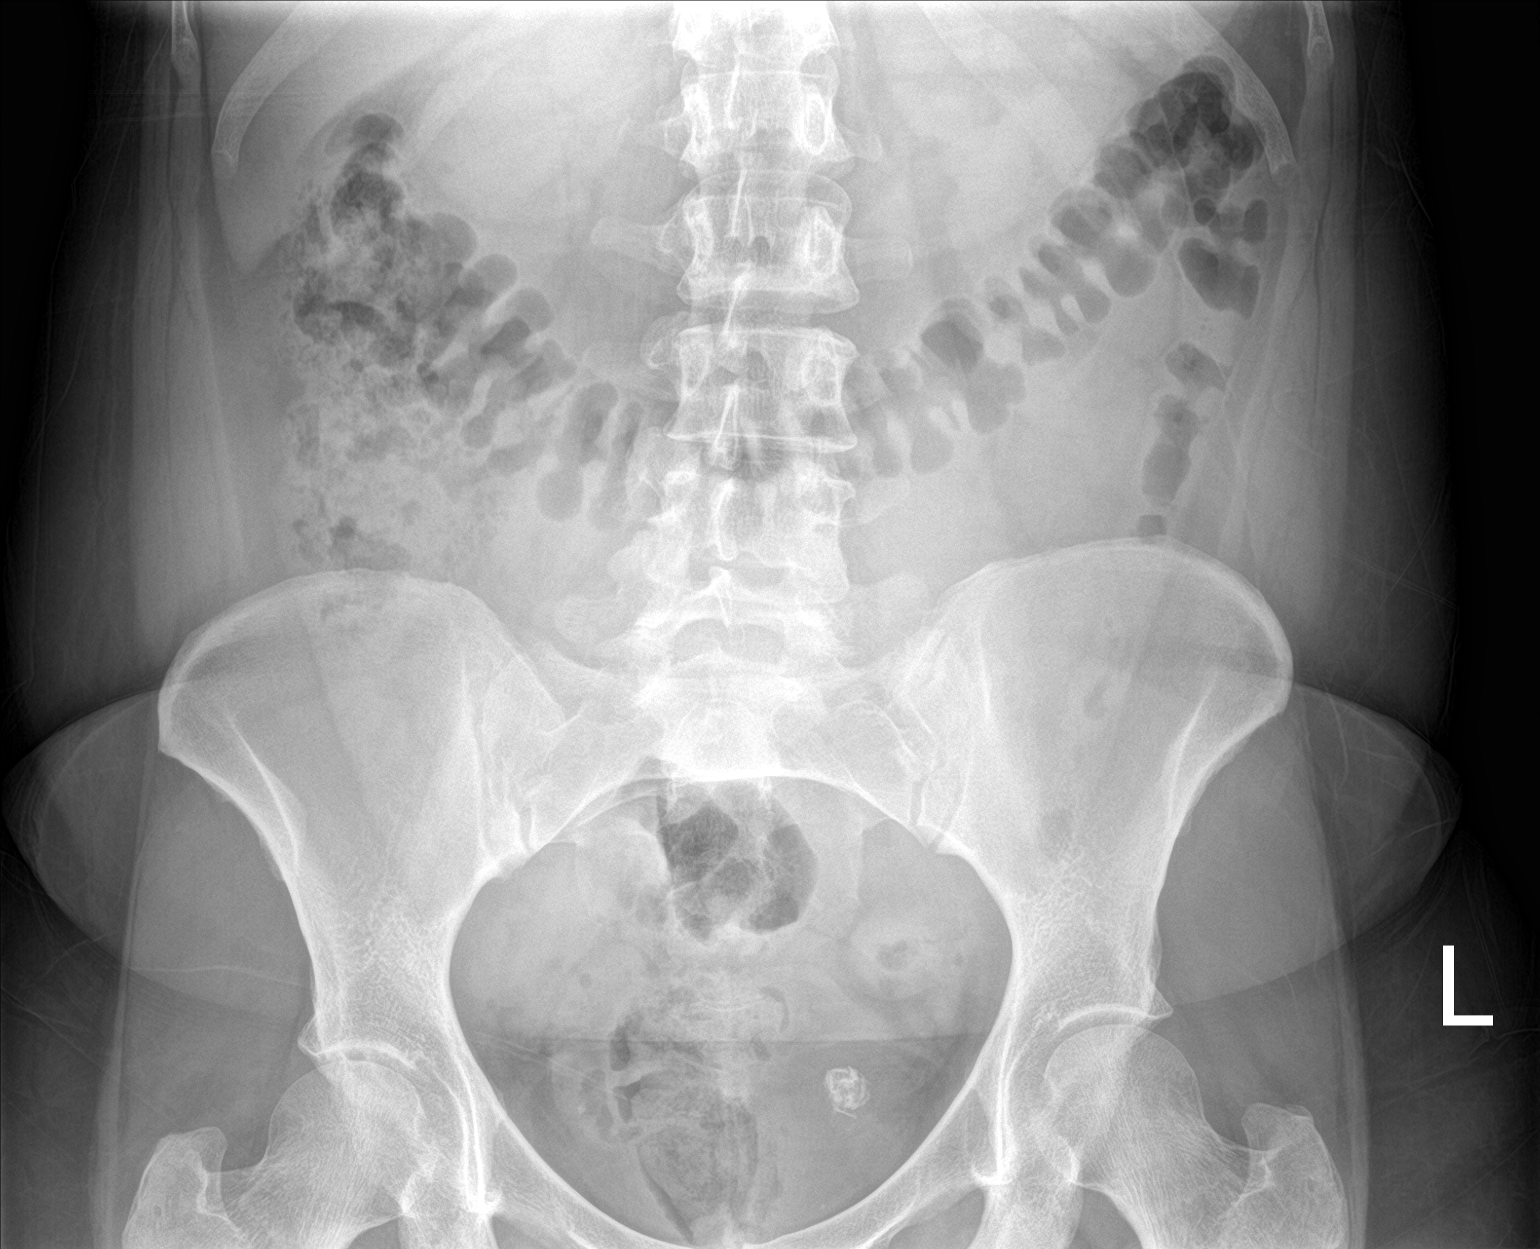

[abdomen erect]
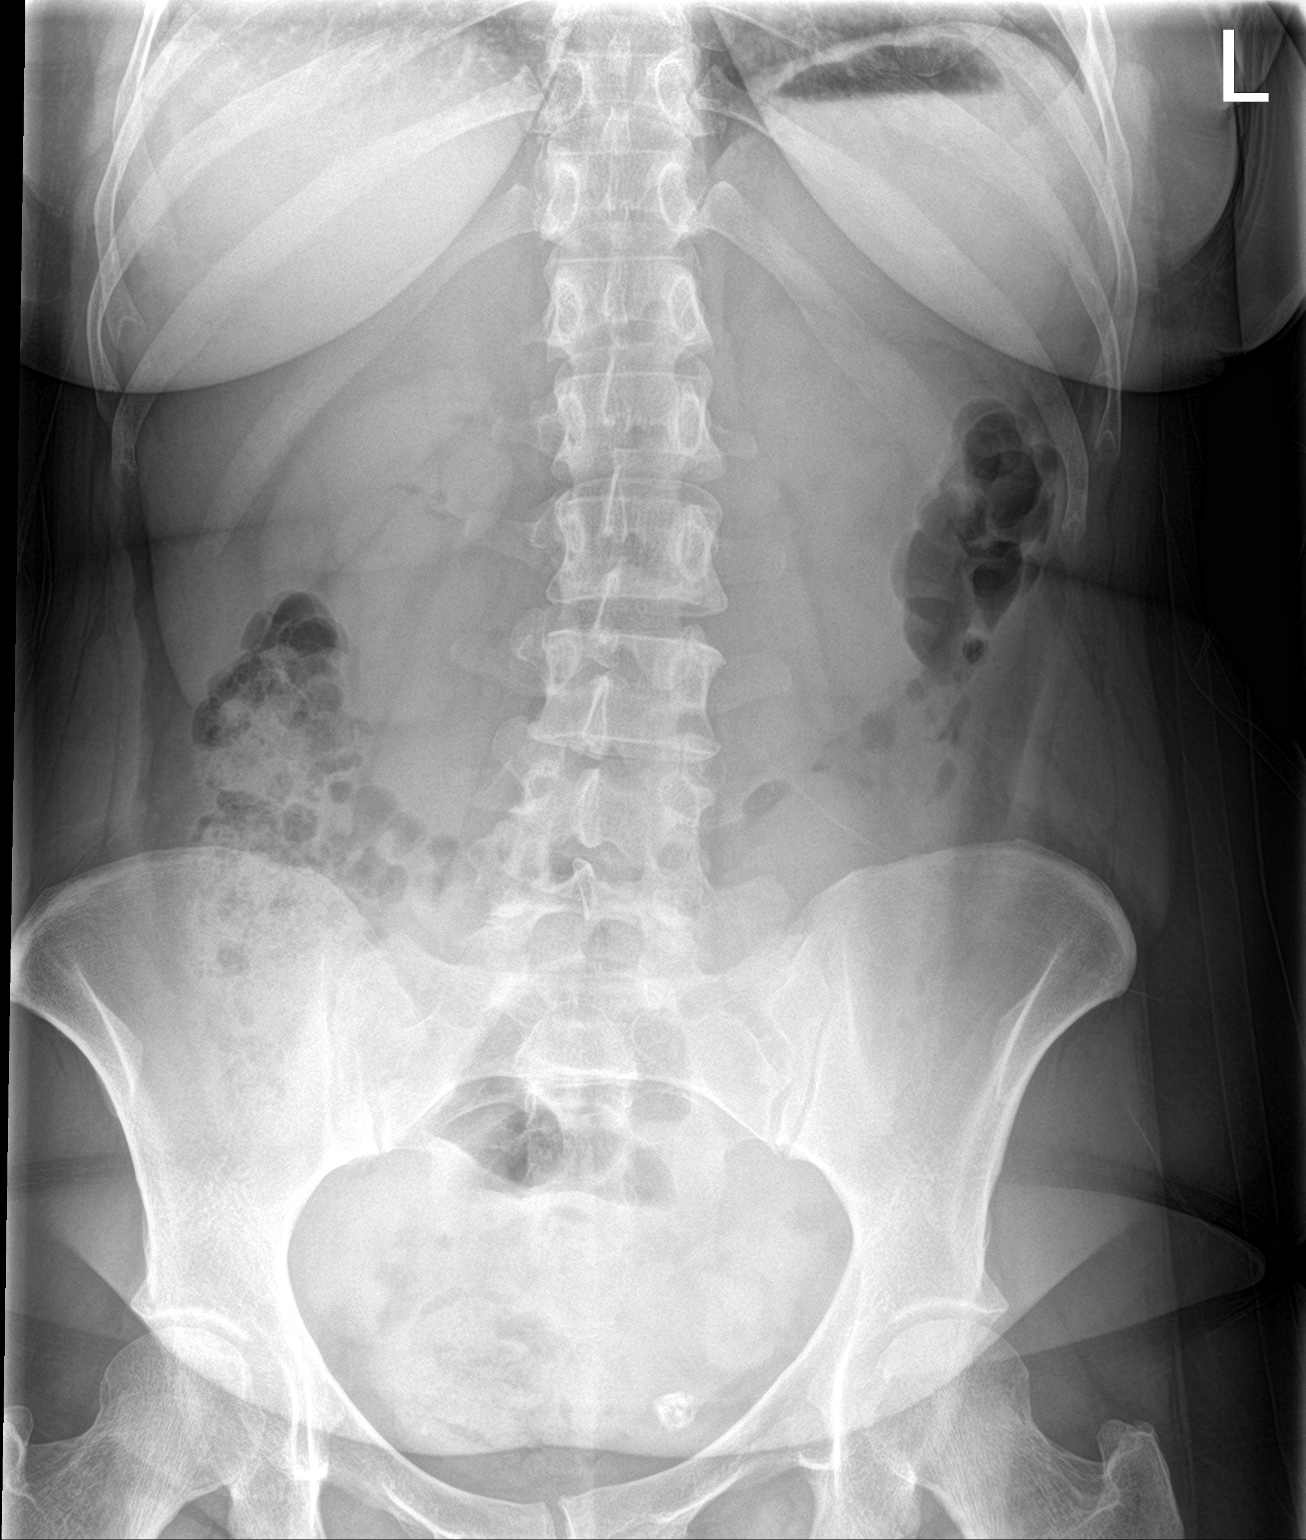

[2 of 2 positions shown; findings below may reference images not displayed]

FINDINGS: No intrauterine device is seen in the abdominopelvic cavity. Left
pelvic calcification corresponds to calcified fibroid on prior
ultrasound. Normal bowel gas pattern. Small volume of colonic stool.
No radiopaque calculi. No concerning intraabdominal mass effect. No
acute osseous abnormalities are seen.
IMPRESSION: 1. No intrauterine device is seen in the abdomen or pelvis.
2. Left calcified uterine fibroid.

## 2021-11-15 DIAGNOSIS — Z419 Encounter for procedure for purposes other than remedying health state, unspecified: Secondary | ICD-10-CM | POA: Diagnosis not present

## 2021-12-13 DIAGNOSIS — Z419 Encounter for procedure for purposes other than remedying health state, unspecified: Secondary | ICD-10-CM | POA: Diagnosis not present

## 2022-01-13 DIAGNOSIS — Z419 Encounter for procedure for purposes other than remedying health state, unspecified: Secondary | ICD-10-CM | POA: Diagnosis not present

## 2022-02-12 DIAGNOSIS — Z419 Encounter for procedure for purposes other than remedying health state, unspecified: Secondary | ICD-10-CM | POA: Diagnosis not present

## 2022-03-15 DIAGNOSIS — Z419 Encounter for procedure for purposes other than remedying health state, unspecified: Secondary | ICD-10-CM | POA: Diagnosis not present

## 2022-04-14 DIAGNOSIS — Z419 Encounter for procedure for purposes other than remedying health state, unspecified: Secondary | ICD-10-CM | POA: Diagnosis not present

## 2022-05-15 DIAGNOSIS — Z419 Encounter for procedure for purposes other than remedying health state, unspecified: Secondary | ICD-10-CM | POA: Diagnosis not present

## 2022-06-11 DIAGNOSIS — S92254A Nondisplaced fracture of navicular [scaphoid] of right foot, initial encounter for closed fracture: Secondary | ICD-10-CM | POA: Diagnosis not present

## 2022-06-11 DIAGNOSIS — S92101A Unspecified fracture of right talus, initial encounter for closed fracture: Secondary | ICD-10-CM | POA: Diagnosis not present

## 2022-06-15 DIAGNOSIS — Z419 Encounter for procedure for purposes other than remedying health state, unspecified: Secondary | ICD-10-CM | POA: Diagnosis not present

## 2022-06-18 DIAGNOSIS — S92101D Unspecified fracture of right talus, subsequent encounter for fracture with routine healing: Secondary | ICD-10-CM | POA: Diagnosis not present

## 2022-06-18 DIAGNOSIS — S92254D Nondisplaced fracture of navicular [scaphoid] of right foot, subsequent encounter for fracture with routine healing: Secondary | ICD-10-CM | POA: Diagnosis not present

## 2022-07-15 DIAGNOSIS — Z419 Encounter for procedure for purposes other than remedying health state, unspecified: Secondary | ICD-10-CM | POA: Diagnosis not present

## 2022-08-15 DIAGNOSIS — Z419 Encounter for procedure for purposes other than remedying health state, unspecified: Secondary | ICD-10-CM | POA: Diagnosis not present

## 2022-09-14 DIAGNOSIS — Z419 Encounter for procedure for purposes other than remedying health state, unspecified: Secondary | ICD-10-CM | POA: Diagnosis not present

## 2022-10-15 DIAGNOSIS — Z419 Encounter for procedure for purposes other than remedying health state, unspecified: Secondary | ICD-10-CM | POA: Diagnosis not present

## 2022-11-15 DIAGNOSIS — Z419 Encounter for procedure for purposes other than remedying health state, unspecified: Secondary | ICD-10-CM | POA: Diagnosis not present

## 2022-11-16 DIAGNOSIS — R062 Wheezing: Secondary | ICD-10-CM | POA: Diagnosis not present

## 2022-11-16 DIAGNOSIS — J101 Influenza due to other identified influenza virus with other respiratory manifestations: Secondary | ICD-10-CM | POA: Diagnosis not present

## 2022-11-16 DIAGNOSIS — R197 Diarrhea, unspecified: Secondary | ICD-10-CM | POA: Diagnosis not present

## 2022-11-16 DIAGNOSIS — Z20822 Contact with and (suspected) exposure to covid-19: Secondary | ICD-10-CM | POA: Diagnosis not present

## 2022-11-22 DIAGNOSIS — M25522 Pain in left elbow: Secondary | ICD-10-CM | POA: Diagnosis not present

## 2022-11-26 DIAGNOSIS — J101 Influenza due to other identified influenza virus with other respiratory manifestations: Secondary | ICD-10-CM | POA: Diagnosis not present

## 2022-11-26 DIAGNOSIS — M791 Myalgia, unspecified site: Secondary | ICD-10-CM | POA: Diagnosis not present

## 2022-11-26 DIAGNOSIS — R112 Nausea with vomiting, unspecified: Secondary | ICD-10-CM | POA: Diagnosis not present

## 2022-11-26 DIAGNOSIS — R051 Acute cough: Secondary | ICD-10-CM | POA: Diagnosis not present

## 2022-12-05 DIAGNOSIS — M79602 Pain in left arm: Secondary | ICD-10-CM | POA: Diagnosis not present

## 2022-12-05 DIAGNOSIS — M792 Neuralgia and neuritis, unspecified: Secondary | ICD-10-CM | POA: Diagnosis not present

## 2022-12-05 DIAGNOSIS — R519 Headache, unspecified: Secondary | ICD-10-CM | POA: Diagnosis not present

## 2022-12-13 DIAGNOSIS — M791 Myalgia, unspecified site: Secondary | ICD-10-CM | POA: Diagnosis not present

## 2022-12-13 DIAGNOSIS — R519 Headache, unspecified: Secondary | ICD-10-CM | POA: Diagnosis not present

## 2022-12-13 DIAGNOSIS — B349 Viral infection, unspecified: Secondary | ICD-10-CM | POA: Diagnosis not present

## 2022-12-14 DIAGNOSIS — Z419 Encounter for procedure for purposes other than remedying health state, unspecified: Secondary | ICD-10-CM | POA: Diagnosis not present

## 2022-12-24 DIAGNOSIS — M791 Myalgia, unspecified site: Secondary | ICD-10-CM | POA: Diagnosis not present

## 2022-12-24 DIAGNOSIS — R059 Cough, unspecified: Secondary | ICD-10-CM | POA: Diagnosis not present

## 2022-12-24 DIAGNOSIS — R5381 Other malaise: Secondary | ICD-10-CM | POA: Diagnosis not present

## 2022-12-24 DIAGNOSIS — R509 Fever, unspecified: Secondary | ICD-10-CM | POA: Diagnosis not present

## 2022-12-24 DIAGNOSIS — J101 Influenza due to other identified influenza virus with other respiratory manifestations: Secondary | ICD-10-CM | POA: Diagnosis not present

## 2023-01-10 DIAGNOSIS — R197 Diarrhea, unspecified: Secondary | ICD-10-CM | POA: Diagnosis not present

## 2023-01-10 DIAGNOSIS — R1030 Lower abdominal pain, unspecified: Secondary | ICD-10-CM | POA: Diagnosis not present

## 2023-01-14 DIAGNOSIS — Z419 Encounter for procedure for purposes other than remedying health state, unspecified: Secondary | ICD-10-CM | POA: Diagnosis not present

## 2023-02-07 ENCOUNTER — Ambulatory Visit: Payer: Medicaid Other | Admitting: Family Medicine

## 2023-02-13 DIAGNOSIS — Z419 Encounter for procedure for purposes other than remedying health state, unspecified: Secondary | ICD-10-CM | POA: Diagnosis not present

## 2023-02-21 ENCOUNTER — Encounter: Payer: Self-pay | Admitting: Family Medicine

## 2023-02-21 ENCOUNTER — Ambulatory Visit (INDEPENDENT_AMBULATORY_CARE_PROVIDER_SITE_OTHER): Payer: Medicaid Other | Admitting: Family Medicine

## 2023-02-21 VITALS — BP 118/68 | HR 70 | Temp 98.2°F | Ht 64.0 in | Wt 173.2 lb

## 2023-02-21 DIAGNOSIS — Z114 Encounter for screening for human immunodeficiency virus [HIV]: Secondary | ICD-10-CM | POA: Diagnosis not present

## 2023-02-21 DIAGNOSIS — G629 Polyneuropathy, unspecified: Secondary | ICD-10-CM

## 2023-02-21 DIAGNOSIS — K649 Unspecified hemorrhoids: Secondary | ICD-10-CM | POA: Diagnosis not present

## 2023-02-21 DIAGNOSIS — F418 Other specified anxiety disorders: Secondary | ICD-10-CM

## 2023-02-21 DIAGNOSIS — M7712 Lateral epicondylitis, left elbow: Secondary | ICD-10-CM

## 2023-02-21 DIAGNOSIS — R197 Diarrhea, unspecified: Secondary | ICD-10-CM

## 2023-02-21 DIAGNOSIS — R140 Abdominal distension (gaseous): Secondary | ICD-10-CM | POA: Diagnosis not present

## 2023-02-21 DIAGNOSIS — Z0000 Encounter for general adult medical examination without abnormal findings: Secondary | ICD-10-CM

## 2023-02-21 DIAGNOSIS — J4540 Moderate persistent asthma, uncomplicated: Secondary | ICD-10-CM | POA: Diagnosis not present

## 2023-02-21 DIAGNOSIS — Z1159 Encounter for screening for other viral diseases: Secondary | ICD-10-CM | POA: Diagnosis not present

## 2023-02-21 LAB — LIPID PANEL
Cholesterol: 214 mg/dL — ABNORMAL HIGH (ref 0–200)
HDL: 50.4 mg/dL (ref >39.00)
LDL Cholesterol: 133 mg/dL — ABNORMAL HIGH (ref 0–99)
NonHDL: 163.1
Total CHOL/HDL Ratio: 4
Triglycerides: 152 mg/dL — ABNORMAL HIGH (ref 0.0–149.0)
VLDL: 30.4 mg/dL (ref 0.0–40.0)

## 2023-02-21 LAB — CBC WITH DIFFERENTIAL/PLATELET
Basophils Absolute: 0.1 10*3/uL (ref 0.0–0.1)
Basophils Relative: 1 % (ref 0.0–3.0)
Eosinophils Absolute: 0.3 10*3/uL (ref 0.0–0.7)
Eosinophils Relative: 4.6 % (ref 0.0–5.0)
HCT: 41.2 % (ref 36.0–46.0)
Hemoglobin: 13.7 g/dL (ref 12.0–15.0)
Lymphocytes Relative: 23.4 % (ref 12.0–46.0)
Lymphs Abs: 1.3 10*3/uL (ref 0.7–4.0)
MCHC: 33.3 g/dL (ref 30.0–36.0)
MCV: 86.1 fl (ref 78.0–100.0)
Monocytes Absolute: 0.3 10*3/uL (ref 0.1–1.0)
Monocytes Relative: 5.5 % (ref 3.0–12.0)
Neutro Abs: 3.6 10*3/uL (ref 1.4–7.7)
Neutrophils Relative %: 65.5 % (ref 43.0–77.0)
Platelets: 296 10*3/uL (ref 150.0–400.0)
RBC: 4.78 Mil/uL (ref 3.87–5.11)
RDW: 14 % (ref 11.5–15.5)
WBC: 5.5 10*3/uL (ref 4.0–10.5)

## 2023-02-21 LAB — URINALYSIS, ROUTINE W REFLEX MICROSCOPIC
Bilirubin Urine: NEGATIVE
Hgb urine dipstick: NEGATIVE
Ketones, ur: NEGATIVE
Leukocytes,Ua: NEGATIVE
Nitrite: NEGATIVE
Specific Gravity, Urine: 1.025 (ref 1.000–1.030)
Total Protein, Urine: NEGATIVE
Urine Glucose: NEGATIVE
Urobilinogen, UA: 0.2 (ref 0.0–1.0)
pH: 5.5 (ref 5.0–8.0)

## 2023-02-21 LAB — HEPATIC FUNCTION PANEL
ALT: 49 U/L — ABNORMAL HIGH (ref 0–35)
AST: 24 U/L (ref 0–37)
Albumin: 4.2 g/dL (ref 3.5–5.2)
Alkaline Phosphatase: 68 U/L (ref 39–117)
Bilirubin, Direct: 0.1 mg/dL (ref 0.0–0.3)
Total Bilirubin: 0.3 mg/dL (ref 0.2–1.2)
Total Protein: 6.9 g/dL (ref 6.0–8.3)

## 2023-02-21 LAB — BASIC METABOLIC PANEL
BUN: 8 mg/dL (ref 6–23)
CO2: 28 mEq/L (ref 19–32)
Calcium: 9.3 mg/dL (ref 8.4–10.5)
Chloride: 105 mEq/L (ref 96–112)
Creatinine, Ser: 0.76 mg/dL (ref 0.40–1.20)
GFR: 91.01 mL/min (ref >60.00)
Glucose, Bld: 77 mg/dL (ref 70–99)
Potassium: 4.2 mEq/L (ref 3.5–5.1)
Sodium: 140 mEq/L (ref 135–145)

## 2023-02-21 LAB — TSH: TSH: 0.76 u[IU]/mL (ref 0.35–5.50)

## 2023-02-21 MED ORDER — CITALOPRAM HYDROBROMIDE 10 MG PO TABS: 10.0000 mg | ORAL_TABLET | Freq: Every day | ORAL | 1 refills | Status: AC

## 2023-02-21 MED ORDER — BUDESONIDE-FORMOTEROL FUMARATE 80-4.5 MCG/ACT IN AERO: 2.0000 | INHALATION_SPRAY | Freq: Two times a day (BID) | RESPIRATORY_TRACT | 3 refills | Status: AC

## 2023-02-21 NOTE — Progress Notes (Signed)
New Patient Office Visit  Subjective    Patient ID: Sheryl Bradley, female    DOB: Jul 16, 1971  Age: 52 y.o. MRN: [id number]  CC:  Chief Complaint  Patient presents with   New Patient (Initial Visit)   Diarrhea/bloating    Started around December of last year. Almost everyday. Drinks plenty of water. Constantly bloated.   Burning sensation of left arm    Intermittent, after a fall in October or November of 2023. Mostly at night.    Asthma    Post COVID, needs inhaler upon exertion.   Urinary Retention    For about three or four years.   Hemorrhoids    Recurrent since pregnancy.    HPI Sheryl Bradley presents to establish care. Encounter Diagnoses  Name Primary?   Healthcare maintenance Yes   Depression with anxiety    Neuropathy    Moderate persistent reactive airway disease without complication    Abdominal bloating    Diarrhea, unspecified type    Hemorrhoids, unspecified hemorrhoid type    Lateral epicondylitis of left elbow    Screening for HIV (human immunodeficiency virus)    Encounter for hepatitis C screening test for low risk patient    Presents with an extensive list of medical issues, problems and concerns.  5 to 55-month history of abdominal bloating with ongoing loose stools.  There has been no fevers chills, weight loss, hematochezia or melena.  Not preceded by antibiotic usage.  Denies GERD.  Tells of intermittent wheezing especially with exertion since her COVID infection back in 2021.  She does not smoke.  Her mother had asthma as does her fraternal twin sister.  She has been using a Ventolin inhaler most every day.  Status post 2 respiratory tract infections this past winter.  She tells of burning paresthesias in her left forearm for 5 to 6 months.  This was preceded by a fall where she injured her left elbow.  X-rays taken at urgent care were negative she tells me.  She is right-hand dominant.  Ongoing history of depression with anxiety.  Celexa has helped in  the past.  She lives with her 39 year old son and 2 year old daughter.  She has not been sexually active since 2018.  Up to that point she had been married to somebody who was an addict and had also been sleeping with men.  Initial screening for HIV was negative at that time.   Outpatient Encounter Medications as of 02/21/2023  Medication Sig   albuterol (VENTOLIN HFA) 108 (90 Base) MCG/ACT inhaler Inhale 2 puffs into the lungs every 4 (four) hours as needed for wheezing or shortness of breath.   Biotin 1000 MCG tablet Take 1,000 mcg by mouth 3 (three) times daily.   budesonide-formoterol (SYMBICORT) 80-4.5 MCG/ACT inhaler Inhale 2 puffs into the lungs 2 (two) times daily. Please rinse mouth with water after second inhalation.   diphenhydrAMINE (BENADRYL) 25 mg capsule Take 25 mg by mouth at bedtime. As needed.   ferrous sulfate 325 (65 FE) MG EC tablet Take 325 mg by mouth daily. As needed.   ondansetron (ZOFRAN-ODT) 8 MG disintegrating tablet Take by mouth. As needed.   citalopram (CELEXA) 10 MG tablet Take 1 tablet (10 mg total) by mouth daily.   [DISCONTINUED] ALPRAZolam (XANAX) 0.5 MG tablet  (Patient not taking: Reported on 02/21/2023)   [DISCONTINUED] citalopram (CELEXA) 10 MG tablet Take 10 mg by mouth daily. (Patient not taking: Reported on 02/21/2023)   [DISCONTINUED] fluconazole (DIFLUCAN) 150 MG tablet  Take 1 tablet (150 mg total) by mouth daily. (Patient not taking: Reported on 02/21/2023)   [DISCONTINUED] FLUoxetine (PROZAC) 20 MG capsule Take 1 capsule (20 mg total) by mouth daily. (Patient not taking: Reported on 02/21/2023)   [DISCONTINUED] medroxyPROGESTERone (DEPO-PROVERA) 150 MG/ML injection Inject 1 mL (150 mg total) into the muscle every 3 (three) months. (Patient not taking: Reported on 02/21/2023)   [DISCONTINUED] megestrol (MEGACE) 40 MG tablet Take 1 tablet (40 mg total) by mouth 2 (two) times daily. (Patient not taking: Reported on 02/21/2023)   No facility-administered encounter  medications on file as of 02/21/2023.    Past Medical History:  Diagnosis Date   Allergy    Known health problems: none     Past Surgical History:  Procedure Laterality Date   KNEE SURGERY Left     Family History  Problem Relation Age of Onset   Heart attack Mother    Hypertension Mother    Stroke Mother    Cancer Father    Hypertension Father    Alcohol abuse Brother    Hypertension Maternal Grandmother    Hypertension Maternal Grandfather    Hypertension Paternal Grandmother    Hypertension Paternal Grandfather     Social History   Socioeconomic History   Marital status: Single    Spouse name: Not on file   Number of children: Not on file   Years of education: Not on file   Highest education level: Not on file  Occupational History   Not on file  Tobacco Use   Smoking status: Never   Smokeless tobacco: Never  Vaping Use   Vaping Use: Never used  Substance and Sexual Activity   Alcohol use: Not Currently   Drug use: Not Currently    Types: Marijuana    Comment: occasionally   Sexual activity: Not Currently    Birth control/protection: None  Other Topics Concern   Not on file  Social History Narrative   Not on file   Social Determinants of Health   Financial Resource Strain: Not on file  Food Insecurity: Not on file  Transportation Needs: Not on file  Physical Activity: Not on file  Stress: Not on file  Social Connections: Not on file  Intimate Partner Violence: Not on file    Review of Systems  Constitutional: Negative.   HENT: Negative.    Eyes:  Negative for blurred vision, discharge and redness.  Respiratory: Negative.    Cardiovascular: Negative.   Gastrointestinal:  Positive for diarrhea. Negative for abdominal pain, blood in stool, constipation, heartburn, melena, nausea and vomiting.  Genitourinary: Negative.   Musculoskeletal:  Positive for joint pain. Negative for myalgias.  Skin:  Negative for rash.  Neurological:  Negative for  tingling, loss of consciousness and weakness.  Endo/Heme/Allergies:  Negative for polydipsia.  Psychiatric/Behavioral:  Positive for depression. Negative for suicidal ideas. The patient is nervous/anxious.          02/21/2023   10:23 AM 11/18/2017   10:55 AM  Depression screen PHQ 2/9  Decreased Interest 2 3  Down, Depressed, Hopeless 2 3  PHQ - 2 Score 4 6  Altered sleeping 2 3  Tired, decreased energy 2 3  Change in appetite 3 3  Feeling bad or failure about yourself  2 3  Trouble concentrating 2 3  Moving slowly or fidgety/restless 0 3  Suicidal thoughts 0 1  PHQ-9 Score 15 25  Difficult doing work/chores Very difficult Very difficult      Objective  BP 118/68 (BP Location: Right Arm, Patient Position: Sitting)   Pulse 70   Temp 98.2 F (36.8 C) (Temporal)   Ht 5\' 4"  (1.626 m)   Wt 173 lb 3.2 oz (78.6 kg)   SpO2 97%   BMI 29.73 kg/m   Physical Exam Constitutional:      General: She is not in acute distress.    Appearance: Normal appearance. She is not ill-appearing, toxic-appearing or diaphoretic.  HENT:     Head: Normocephalic and atraumatic.     Right Ear: External ear normal.     Left Ear: External ear normal.     Mouth/Throat:     Mouth: Mucous membranes are moist.     Pharynx: Oropharynx is clear. No oropharyngeal exudate or posterior oropharyngeal erythema.  Eyes:     General: No scleral icterus.       Right eye: No discharge.        Left eye: No discharge.     Extraocular Movements: Extraocular movements intact.     Conjunctiva/sclera: Conjunctivae normal.     Pupils: Pupils are equal, round, and reactive to light.  Cardiovascular:     Rate and Rhythm: Normal rate and regular rhythm.  Pulmonary:     Effort: Pulmonary effort is normal. No respiratory distress.     Breath sounds: Normal breath sounds. No wheezing or rales.  Abdominal:     General: Bowel sounds are normal.     Tenderness: There is no abdominal tenderness. There is no guarding or  rebound.  Musculoskeletal:     Left elbow: Normal range of motion. Tenderness present in lateral epicondyle.     Cervical back: No rigidity or tenderness.  Skin:    General: Skin is warm and dry.  Neurological:     Mental Status: She is alert and oriented to person, place, and time.  Psychiatric:        Mood and Affect: Mood normal.        Behavior: Behavior normal.         Assessment & Plan:   Healthcare maintenance -     CBC with Differential/Platelet -     Basic metabolic panel -     Hepatic function panel -     Lipid panel -     Urinalysis, Routine w reflex microscopic  Depression with anxiety -     TSH -     Citalopram Hydrobromide; Take 1 tablet (10 mg total) by mouth daily.  Dispense: 30 tablet; Refill: 1  Neuropathy -     Ambulatory referral to Sports Medicine  Moderate persistent reactive airway disease without complication -     Ambulatory referral to Pulmonology -     Budesonide-Formoterol Fumarate; Inhale 2 puffs into the lungs 2 (two) times daily. Please rinse mouth with water after second inhalation.  Dispense: 1 each; Refill: 3  Abdominal bloating -     Ambulatory referral to Gastroenterology  Diarrhea, unspecified type -     Ambulatory referral to Gastroenterology  Hemorrhoids, unspecified hemorrhoid type -     Ambulatory referral to Gastroenterology  Lateral epicondylitis of left elbow -     Ambulatory referral to Sports Medicine  Screening for HIV (human immunodeficiency virus) -     HIV Antibody (routine testing w rflx)  Encounter for hepatitis C screening test for low risk patient -     Hepatitis C antibody     Return in about 6 weeks (around 04/04/2023).  Spent over 45 minutes taking this  patient's history, review reviewing the medical record, examining and determining the treatment plan.  Mliss Sax, MD

## 2023-02-22 LAB — HEPATITIS C ANTIBODY: Hepatitis C Ab: NONREACTIVE

## 2023-02-22 LAB — TIQ-NTM

## 2023-02-22 LAB — HIV ANTIBODY (ROUTINE TESTING W REFLEX): HIV 1&2 Ab, 4th Generation: NONREACTIVE

## 2023-02-26 ENCOUNTER — Telehealth: Payer: Self-pay | Admitting: Family Medicine

## 2023-02-26 NOTE — Telephone Encounter (Signed)
Pt called about her job forms for accomodation. Her job is trying to work with her . Please call when ready.

## 2023-03-06 ENCOUNTER — Ambulatory Visit: Payer: Medicaid Other | Admitting: Obstetrics and Gynecology

## 2023-03-06 NOTE — Telephone Encounter (Signed)
Called patient regarding forms no answer LMTCB

## 2023-03-08 ENCOUNTER — Encounter: Payer: Self-pay | Admitting: Physician Assistant

## 2023-03-08 NOTE — Telephone Encounter (Signed)
No forms in office to be filled out for patient have tried calling several times for clarification on these forms with no answer left message to call back will wait until patient comes in for upcoming appointment or call back.

## 2023-03-16 DIAGNOSIS — Z419 Encounter for procedure for purposes other than remedying health state, unspecified: Secondary | ICD-10-CM | POA: Diagnosis not present

## 2023-03-18 ENCOUNTER — Ambulatory Visit: Payer: Medicaid Other | Admitting: Family Medicine

## 2023-03-18 NOTE — Progress Notes (Deleted)
   Rubin Payor, PhD, LAT, ATC acting as a scribe for Clementeen Graham, MD.  Sheryl Bradley is a 52 y.o. female who presents to [facility] today for L elbow pain x ***. Pt locates pain to ***  Grip strength: Radiates:  Paresthesia: Aggravates: Treatments tried:  Pertinent review of systems: ***  Relevant historical information: ***   Exam:  There were no vitals taken for this visit. General: Well Developed, well nourished, and in no acute distress.   MSK: ***    Lab and Radiology Results No results found for this or any previous visit (from the past 72 hour(s)). No results found.     Assessment and Plan: 52 y.o. female with ***   PDMP not reviewed this encounter. No orders of the defined types were placed in this encounter.  No orders of the defined types were placed in this encounter.    Discussed warning signs or symptoms. Please see discharge instructions. Patient expresses understanding.   ***

## 2023-04-04 ENCOUNTER — Ambulatory Visit: Payer: Medicaid Other | Admitting: Family Medicine

## 2023-04-15 ENCOUNTER — Institutional Professional Consult (permissible substitution): Payer: Medicaid Other | Admitting: Internal Medicine

## 2023-04-15 DIAGNOSIS — Z419 Encounter for procedure for purposes other than remedying health state, unspecified: Secondary | ICD-10-CM | POA: Diagnosis not present

## 2023-04-22 ENCOUNTER — Telehealth: Payer: Self-pay

## 2023-04-22 NOTE — Telephone Encounter (Signed)
Chart review completed for patient. Patient is due for screening mammogram. Mychart message sent to patient to inquire about scheduling mammogram.  Brealynn Contino, Population Health Specialist.  

## 2023-05-16 DIAGNOSIS — Z419 Encounter for procedure for purposes other than remedying health state, unspecified: Secondary | ICD-10-CM | POA: Diagnosis not present

## 2023-05-27 ENCOUNTER — Ambulatory Visit: Payer: Medicaid Other | Admitting: Physician Assistant

## 2023-06-16 DIAGNOSIS — Z419 Encounter for procedure for purposes other than remedying health state, unspecified: Secondary | ICD-10-CM | POA: Diagnosis not present

## 2023-07-16 DIAGNOSIS — Z419 Encounter for procedure for purposes other than remedying health state, unspecified: Secondary | ICD-10-CM | POA: Diagnosis not present

## 2023-08-16 DIAGNOSIS — Z419 Encounter for procedure for purposes other than remedying health state, unspecified: Secondary | ICD-10-CM | POA: Diagnosis not present

## 2023-09-09 DIAGNOSIS — R051 Acute cough: Secondary | ICD-10-CM | POA: Diagnosis not present

## 2023-09-09 DIAGNOSIS — J019 Acute sinusitis, unspecified: Secondary | ICD-10-CM | POA: Diagnosis not present

## 2023-09-09 DIAGNOSIS — J209 Acute bronchitis, unspecified: Secondary | ICD-10-CM | POA: Diagnosis not present

## 2023-09-15 DIAGNOSIS — Z419 Encounter for procedure for purposes other than remedying health state, unspecified: Secondary | ICD-10-CM | POA: Diagnosis not present

## 2023-10-16 DIAGNOSIS — Z419 Encounter for procedure for purposes other than remedying health state, unspecified: Secondary | ICD-10-CM | POA: Diagnosis not present

## 2023-11-16 DIAGNOSIS — Z419 Encounter for procedure for purposes other than remedying health state, unspecified: Secondary | ICD-10-CM | POA: Diagnosis not present

## 2023-12-14 DIAGNOSIS — Z419 Encounter for procedure for purposes other than remedying health state, unspecified: Secondary | ICD-10-CM | POA: Diagnosis not present

## 2023-12-22 DIAGNOSIS — R051 Acute cough: Secondary | ICD-10-CM | POA: Diagnosis not present

## 2023-12-22 DIAGNOSIS — J209 Acute bronchitis, unspecified: Secondary | ICD-10-CM | POA: Diagnosis not present

## 2023-12-22 DIAGNOSIS — J45998 Other asthma: Secondary | ICD-10-CM | POA: Diagnosis not present

## 2023-12-22 DIAGNOSIS — R062 Wheezing: Secondary | ICD-10-CM | POA: Diagnosis not present

## 2024-01-25 DIAGNOSIS — Z419 Encounter for procedure for purposes other than remedying health state, unspecified: Secondary | ICD-10-CM | POA: Diagnosis not present

## 2024-02-24 DIAGNOSIS — Z419 Encounter for procedure for purposes other than remedying health state, unspecified: Secondary | ICD-10-CM | POA: Diagnosis not present

## 2024-03-26 DIAGNOSIS — Z419 Encounter for procedure for purposes other than remedying health state, unspecified: Secondary | ICD-10-CM | POA: Diagnosis not present

## 2024-04-25 DIAGNOSIS — Z419 Encounter for procedure for purposes other than remedying health state, unspecified: Secondary | ICD-10-CM | POA: Diagnosis not present

## 2024-05-26 DIAGNOSIS — Z419 Encounter for procedure for purposes other than remedying health state, unspecified: Secondary | ICD-10-CM | POA: Diagnosis not present

## 2024-06-26 DIAGNOSIS — Z419 Encounter for procedure for purposes other than remedying health state, unspecified: Secondary | ICD-10-CM | POA: Diagnosis not present

## 2024-09-25 DIAGNOSIS — Z419 Encounter for procedure for purposes other than remedying health state, unspecified: Secondary | ICD-10-CM | POA: Diagnosis not present

## 2024-10-12 DIAGNOSIS — M7918 Myalgia, other site: Secondary | ICD-10-CM | POA: Diagnosis not present

## 2024-10-12 DIAGNOSIS — R051 Acute cough: Secondary | ICD-10-CM | POA: Diagnosis not present

## 2024-10-12 DIAGNOSIS — R519 Headache, unspecified: Secondary | ICD-10-CM | POA: Diagnosis not present

## 2024-10-12 DIAGNOSIS — J101 Influenza due to other identified influenza virus with other respiratory manifestations: Secondary | ICD-10-CM | POA: Diagnosis not present

## 2024-10-12 DIAGNOSIS — J45901 Unspecified asthma with (acute) exacerbation: Secondary | ICD-10-CM | POA: Diagnosis not present

## 2024-10-12 DIAGNOSIS — R509 Fever, unspecified: Secondary | ICD-10-CM | POA: Diagnosis not present
